# Patient Record
Sex: Male | Born: 1970 | Race: White | Hispanic: No | Marital: Married | Smoking: Never smoker
Health system: Southern US, Community
[De-identification: ages and names within clinical notes are randomized; demographics above are authoritative.]

## PROBLEM LIST (undated history)

## (undated) DIAGNOSIS — K219 Gastro-esophageal reflux disease without esophagitis: Secondary | ICD-10-CM

## (undated) DIAGNOSIS — R51 Headache: Secondary | ICD-10-CM

## (undated) DIAGNOSIS — M1990 Unspecified osteoarthritis, unspecified site: Secondary | ICD-10-CM

## (undated) DIAGNOSIS — F419 Anxiety disorder, unspecified: Secondary | ICD-10-CM

## (undated) DIAGNOSIS — N200 Calculus of kidney: Secondary | ICD-10-CM

## (undated) HISTORY — DX: Gastro-esophageal reflux disease without esophagitis: K21.9

## (undated) HISTORY — DX: Calculus of kidney: N20.0

## (undated) HISTORY — DX: Headache: R51

## (undated) HISTORY — DX: Unspecified osteoarthritis, unspecified site: M19.90

---

## 1999-02-12 ENCOUNTER — Encounter: Payer: Self-pay | Admitting: Emergency Medicine

## 1999-02-12 ENCOUNTER — Emergency Department (HOSPITAL_COMMUNITY): Admission: EM | Admit: 1999-02-12 | Discharge: 1999-02-12 | Payer: Self-pay | Admitting: Emergency Medicine

## 2002-03-12 ENCOUNTER — Encounter: Payer: Self-pay | Admitting: Emergency Medicine

## 2002-03-12 ENCOUNTER — Emergency Department (HOSPITAL_COMMUNITY): Admission: EM | Admit: 2002-03-12 | Discharge: 2002-03-12 | Payer: Self-pay | Admitting: Emergency Medicine

## 2002-12-19 ENCOUNTER — Encounter: Payer: Self-pay | Admitting: Emergency Medicine

## 2002-12-19 ENCOUNTER — Emergency Department (HOSPITAL_COMMUNITY): Admission: EM | Admit: 2002-12-19 | Discharge: 2002-12-19 | Payer: Self-pay | Admitting: Emergency Medicine

## 2004-02-26 ENCOUNTER — Emergency Department (HOSPITAL_COMMUNITY): Admission: EM | Admit: 2004-02-26 | Discharge: 2004-02-27 | Payer: Self-pay | Admitting: Emergency Medicine

## 2006-01-06 ENCOUNTER — Emergency Department (HOSPITAL_COMMUNITY): Admission: EM | Admit: 2006-01-06 | Discharge: 2006-01-06 | Payer: Self-pay | Admitting: Emergency Medicine

## 2007-12-16 ENCOUNTER — Encounter: Admission: RE | Admit: 2007-12-16 | Discharge: 2007-12-16 | Payer: Self-pay | Admitting: Family Medicine

## 2008-11-10 ENCOUNTER — Encounter: Admission: RE | Admit: 2008-11-10 | Discharge: 2008-11-10 | Payer: Self-pay | Admitting: Internal Medicine

## 2010-02-17 ENCOUNTER — Emergency Department (HOSPITAL_COMMUNITY): Admission: EM | Admit: 2010-02-17 | Discharge: 2010-02-18 | Payer: Self-pay | Admitting: Emergency Medicine

## 2010-02-17 ENCOUNTER — Ambulatory Visit (HOSPITAL_COMMUNITY): Admission: EM | Admit: 2010-02-17 | Discharge: 2010-02-17 | Payer: Self-pay | Admitting: Emergency Medicine

## 2010-07-17 HISTORY — PX: ANTERIOR CERVICAL DECOMP/DISCECTOMY FUSION: SHX1161

## 2010-11-28 ENCOUNTER — Ambulatory Visit: Payer: Self-pay | Admitting: Internal Medicine

## 2010-12-02 ENCOUNTER — Ambulatory Visit (INDEPENDENT_AMBULATORY_CARE_PROVIDER_SITE_OTHER): Payer: Self-pay | Admitting: Internal Medicine

## 2010-12-02 ENCOUNTER — Encounter: Payer: Self-pay | Admitting: Internal Medicine

## 2010-12-02 VITALS — BP 114/82 | HR 95 | Ht 69.0 in | Wt 211.0 lb

## 2010-12-02 DIAGNOSIS — Z136 Encounter for screening for cardiovascular disorders: Secondary | ICD-10-CM

## 2010-12-02 DIAGNOSIS — Z01818 Encounter for other preprocedural examination: Secondary | ICD-10-CM

## 2010-12-02 DIAGNOSIS — M5412 Radiculopathy, cervical region: Secondary | ICD-10-CM

## 2010-12-02 LAB — CBC WITH DIFFERENTIAL/PLATELET
Basophils Absolute: 0 10*3/uL (ref 0.0–0.1)
Lymphocytes Relative: 26.4 % (ref 12.0–46.0)
Monocytes Relative: 7.7 % (ref 3.0–12.0)
Neutrophils Relative %: 64.7 % (ref 43.0–77.0)
Platelets: 281 10*3/uL (ref 150.0–400.0)
RDW: 11.9 % (ref 11.5–14.6)

## 2010-12-02 LAB — BASIC METABOLIC PANEL
CO2: 30 mEq/L (ref 19–32)
Calcium: 9.9 mg/dL (ref 8.4–10.5)
Chloride: 104 mEq/L (ref 96–112)
Potassium: 4.1 mEq/L (ref 3.5–5.1)
Sodium: 139 mEq/L (ref 135–145)

## 2010-12-04 ENCOUNTER — Encounter: Payer: Self-pay | Admitting: Internal Medicine

## 2010-12-04 DIAGNOSIS — Z01818 Encounter for other preprocedural examination: Secondary | ICD-10-CM | POA: Insufficient documentation

## 2010-12-04 DIAGNOSIS — M5412 Radiculopathy, cervical region: Secondary | ICD-10-CM | POA: Insufficient documentation

## 2010-12-04 NOTE — Assessment & Plan Note (Signed)
Stable. Cervical spine surgery scheduled and pending

## 2010-12-04 NOTE — Assessment & Plan Note (Signed)
Obtain CBC, Chem-7. EKG obtained demonstrated normal sinus rhythm with normal intervals and axis. No evidence of ischemic change. Obtain chest x-ray PA and lateral. Pending these results overall there is no medical contraindication for surgery however in relation to general anesthesia there is family history of intolerance.

## 2010-12-04 NOTE — Progress Notes (Signed)
  Subjective:    Patient ID: Marciano Sequin, male    DOB: 07/31/1970, 40 y.o.   MRN: [id number]  HPI patient presents to clinic for preoperative evaluation prior to neck surgery. Has left arm radicular pain and paresthesias and is scheduled for what appears to be discectomy and fusion in the next several weeks. Available records reviewed and has history of kidney stones and community-acquired pneumonia August 2011 right upper lobe. States pneumonia symptoms resolved and has no cough or fever. Has had no prior surgeries therefore noted previous difficulty with anesthesia and denies any known coagulopathy or difficulty with she bleeding or bruisability. Does state his mother has had unspecified difficulty with anesthesia in the past however. Has no known cardiac disease or pulmonary disease. Notes intermittent mild atypical left lateral chest pain described as a muscle spasm. It is nonexertional without radiation not associated with diaphoresis or shortness of breath.  Reviewed past medical history, past surgical history, medications, allergies, social history and family history    Review of Systems  Cardiovascular: Positive for chest pain.  Musculoskeletal: Negative for back pain, arthralgias and gait problem.  Neurological: Positive for numbness. Negative for tremors and weakness.  All other systems reviewed and are negative.       Objective:   Physical Exam    Physical Exam  Vitals reviewed. Constitutional:  appears well-developed and well-nourished. No distress.  HENT:  Head: Normocephalic and atraumatic.  Right Ear: Tympanic membrane, external ear and ear canal normal.  Left Ear: Tympanic membrane, external ear and ear canal normal.  Nose: Nose normal.  Mouth/Throat: Oropharynx is clear and moist. No oropharyngeal exudate.  Eyes: Conjunctivae and EOM are normal. Pupils are equal, round, and reactive to light. Right eye exhibits no discharge. Left eye exhibits no discharge. No scleral  icterus.  Neck: Neck supple. No thyromegaly present. No carotid bruits Cardiovascular: Normal rate, regular rhythm and normal heart sounds.  Exam reveals no gallop and no friction rub.   No murmur heard. Pulmonary/Chest: Effort normal and breath sounds normal. No respiratory distress.  has no wheezes.  has no rales.  Abdomen: Soft, nondistended, nontender to palpation, positive bowel sounds. No masses or organomegaly noted. Lymphadenopathy:   no cervical adenopathy.  Neurological:  is alert.  Skin: Skin is warm and dry.  not diaphoretic.  Psychiatric: normal mood and affect.      Assessment & Plan:

## 2010-12-06 ENCOUNTER — Ambulatory Visit (INDEPENDENT_AMBULATORY_CARE_PROVIDER_SITE_OTHER)
Admission: RE | Admit: 2010-12-06 | Discharge: 2010-12-06 | Disposition: A | Discharge status: Home / Self Care | Payer: Self-pay | Source: Ambulatory Visit | Attending: Internal Medicine | Admitting: Internal Medicine

## 2010-12-06 DIAGNOSIS — Z01818 Encounter for other preprocedural examination: Secondary | ICD-10-CM

## 2010-12-07 ENCOUNTER — Telehealth: Payer: Self-pay

## 2010-12-07 NOTE — Progress Notes (Signed)
Done and documented.

## 2010-12-07 NOTE — Telephone Encounter (Signed)
Message copied by Kyung Rudd on Wed Dec 07, 2010  4:12 PM ------      Message from: Clifton Custard      Created: Tue Dec 06, 2010 12:33 PM       cxr nl

## 2010-12-07 NOTE — Telephone Encounter (Signed)
Left message to notify pt labs and chest x ray normal. Advised pt to call offc with any further question or concerns

## 2010-12-08 ENCOUNTER — Encounter: Payer: Self-pay | Admitting: Internal Medicine

## 2011-01-03 ENCOUNTER — Encounter (HOSPITAL_COMMUNITY)
Admission: RE | Admit: 2011-01-03 | Discharge: 2011-01-03 | Disposition: A | Discharge status: Home / Self Care | Payer: Worker's Compensation | Source: Ambulatory Visit | Attending: Orthopedic Surgery | Admitting: Orthopedic Surgery

## 2011-01-03 ENCOUNTER — Other Ambulatory Visit (HOSPITAL_COMMUNITY): Payer: Self-pay | Admitting: Orthopedic Surgery

## 2011-01-03 DIAGNOSIS — M4322 Fusion of spine, cervical region: Secondary | ICD-10-CM

## 2011-01-03 LAB — SURGICAL PCR SCREEN: MRSA, PCR: NEGATIVE

## 2011-01-03 LAB — CBC
Hemoglobin: 15.8 g/dL (ref 13.0–17.0)
MCH: 31 pg (ref 26.0–34.0)
MCV: 85.1 fL (ref 78.0–100.0)
RBC: 5.1 MIL/uL (ref 4.22–5.81)

## 2011-01-04 ENCOUNTER — Ambulatory Visit (HOSPITAL_COMMUNITY)
Admission: RE | Admit: 2011-01-04 | Discharge: 2011-01-05 | Disposition: A | Discharge status: Home / Self Care | Payer: Worker's Compensation | Source: Ambulatory Visit | Attending: Orthopedic Surgery | Admitting: Orthopedic Surgery

## 2011-01-04 ENCOUNTER — Ambulatory Visit (HOSPITAL_COMMUNITY): Payer: Worker's Compensation

## 2011-01-04 DIAGNOSIS — Z01812 Encounter for preprocedural laboratory examination: Secondary | ICD-10-CM | POA: Insufficient documentation

## 2011-01-04 DIAGNOSIS — M5020 Other cervical disc displacement, unspecified cervical region: Secondary | ICD-10-CM | POA: Insufficient documentation

## 2011-01-04 DIAGNOSIS — M47812 Spondylosis without myelopathy or radiculopathy, cervical region: Secondary | ICD-10-CM | POA: Insufficient documentation

## 2011-01-04 DIAGNOSIS — M5030 Other cervical disc degeneration, unspecified cervical region: Secondary | ICD-10-CM | POA: Insufficient documentation

## 2011-01-04 LAB — URINALYSIS, ROUTINE W REFLEX MICROSCOPIC
Hgb urine dipstick: NEGATIVE
Nitrite: NEGATIVE
Specific Gravity, Urine: 1.018 (ref 1.005–1.030)
Urobilinogen, UA: 0.2 mg/dL (ref 0.0–1.0)

## 2011-01-05 LAB — CBC
MCV: 86.1 fL (ref 78.0–100.0)
Platelets: 313 10*3/uL (ref 150–400)
RBC: 4.9 MIL/uL (ref 4.22–5.81)
WBC: 10.6 10*3/uL — ABNORMAL HIGH (ref 4.0–10.5)

## 2011-01-10 ENCOUNTER — Encounter: Payer: Self-pay | Admitting: Internal Medicine

## 2011-01-10 ENCOUNTER — Ambulatory Visit (INDEPENDENT_AMBULATORY_CARE_PROVIDER_SITE_OTHER): Payer: BC Managed Care – PPO | Admitting: Internal Medicine

## 2011-01-10 DIAGNOSIS — K219 Gastro-esophageal reflux disease without esophagitis: Secondary | ICD-10-CM

## 2011-01-10 DIAGNOSIS — M5412 Radiculopathy, cervical region: Secondary | ICD-10-CM

## 2011-01-10 MED ORDER — OMEPRAZOLE 40 MG PO CPDR: 40.0000 mg | DELAYED_RELEASE_CAPSULE | Freq: Every day | ORAL | Status: DC

## 2011-01-10 MED ORDER — TRAMADOL HCL 50 MG PO TABS: 50.0000 mg | ORAL_TABLET | Freq: Four times a day (QID) | ORAL | Status: DC | PRN

## 2011-01-15 DIAGNOSIS — K219 Gastro-esophageal reflux disease without esophagitis: Secondary | ICD-10-CM | POA: Insufficient documentation

## 2011-01-15 NOTE — Assessment & Plan Note (Signed)
Intolerant of 2 narcotics. Attempt Ultram p.r.n. No history of seizures

## 2011-01-15 NOTE — Assessment & Plan Note (Signed)
Begin omeprazole 40 mg b.i.d. x2 days followed by 40 mg daily. Prescription provided. Discussed possible contribution from esophageal retraction during surgery which is typically transient. Followup if no improvement or worsening.

## 2011-01-15 NOTE — Progress Notes (Signed)
  Subjective:    Patient ID: Marciano Sequin, male    DOB: 1971-07-04, 40 y.o.   MRN: [id number]  HPI Patient presents to clinic for evaluation of possible GERD. Recently completed anterior cervical discectomy and fusion approximate 6 days ago. Was prescribed Percocet which he could not tolerate followed by Norco which  he could not tolerate. Currently taking no pain medication. Next intermittent episodes of chest pressure, acid reflux and regurgitation of food. Denies dysphagia. Surgery office instructed him to begin omeprazole OTC which is done for several days. No alleviating or exacerbating factors. No other complaints.    Review of Systems  Constitutional: Negative for fever and chills.  Gastrointestinal: Negative for nausea, vomiting, abdominal pain and abdominal distention.  Musculoskeletal: Positive for arthralgias. Negative for back pain.       Objective:   Physical Exam  Nursing note and vitals reviewed. Constitutional: He appears well-developed and well-nourished. No distress.  HENT:  Head: Normocephalic and atraumatic.  Right Ear: External ear normal.  Nose: Nose normal.  Eyes: Conjunctivae are normal. No scleral icterus.  Neck:       Currently wearing hard cervical collar  Neurological: He is alert.  Skin: He is not diaphoretic.          Assessment & Plan:

## 2011-01-17 NOTE — Op Note (Signed)
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  OPERATIVE PROCEDURE:  Anterior cervical diskectomy and fusion C5-6, C6- 7.  COMPLICATIONS:  None.  CONDITION:  Stable.  INSTRUMENTATION SYSTEM USED:  Synthes anterior cervical vector plate 34- mm in length with 16-mm screws into the body of C5, 14-mm screws into the bodies of 6 and 7.  Titanium Titan intervertebral graft, 8-mm medium lordotic packed with DBX mix.  HISTORY:  This is a very pleasant 40 year old gentleman who is having progressive debilitating neck and left arm pain.  Attempts at conservative management have failed to alleviate his symptoms.  As a result, we discussed surgical solution to his problem.  All appropriate risks, benefits, and alternatives of surgery were discussed and consent was obtained.  OPERATIVE NOTE:  The patient was brought to the operating room, placed supine on the operating table.  After successful induction of general anesthesia and endotracheal intubation, TEDs, SCDs were applied.  Rolled towels were placed between the shoulder blades and the neck was prepped and draped in standard fashion.  Once this was completed, a time-out was done to confirm patient procedure, affected extremity, and all other pertinent important data.  Once that was completed, we then proceeded with the surgery.  X-ray was taken to confirm the appropriate incision site and a transverse  incision was made on the left side of the neck. Sharp dissection was carried out down and through the platysma.  A standard Clementeen Graham approach was taken dissecting bluntly through the deep cervical fascia sweeping the trachea and esophagus to the right identifying protected carotid sheath with a finger on the left.  Once I was able to palpate the anterior longitudinal ligament, I then placed a retractor to hold the trachea and esophagus.  I then placed a needle into the 5-6 disk space. I took an x-ray and confirmed that at the appropriate level.  Once this was done, I used bipolar electrocautery to mobilize the longus coli muscles from the midbody of C5 to the midbody of C7.  Once this was done bilaterally, I placed self-retaining retractor, Caspar blades underneath the longus coli muscle, deflated the endotracheal cuff and expanded to appropriate width.  I then placed distraction pins into the bodies of C7 and C6 and distracted the disk space.  An annulotomy was performed with the #15 blade scalpel and then using a combination of pituitary rongeurs, curettes, and Kerrison rongeurs, I removed all the disk material.  I then used a small microcurette to develop plane underneath the posterior longitudinal ligament.  Once this was done, I used a 1-mm Kerrison to resect the posterior longitudinal ligament.  I then removed some of the osteophyte from  the uncovertebral joint.  At this point, I had an adequate decompression.  I rasped the endplates and used a high-speed bur to take down some remaining osteophyte.  Once this was done, I irrigated the wound copiously with normal saline, made sure I had bleeding subchondral bone at both endplates and then trialed intervertebral spaces.  I took an 8-mm medium Titan titanium cage, packed it with DBX with cortical and cancellous chips mix and malleted to the appropriate depth.  At this point, I removed the distraction pin at C7 and then  repositioned at C5 and distracted the 5-6 disk space.  I used the same technique to perform a diskectomy here.  At this time, I was able to pull out a herniated fragmented disk on the left side.  This allowed me access to get underneath the posterior longitudinal ligament and then resect it with a 1-mm Kerrison.  Again using a high-speed bur, I took down some of the osteophyte.  I then rasped the endplates and placed the same size graft. At this point, I had excellent fixation and good restoration of intervertebral height.  I contoured an anterior cervical Synthes vector plate and secured it with self-drilling screws into the bodies of C5, C6, and C7 without complication.  Once all screws were properly torqued down so that the anti-backout device was locked in place.  I then swept the esophagus to ensure that it did not become entrapped beneath the plate.  Once I was assured that the esophagus was free from being entrapped, I then returned it to the midline with the trachea.  I then irrigated copiously with normal saline and made sure I had hemostasis. Once this was confirmed, I closed the platysma with interrupted 2-0 Vicryl sutures and skin with 3-0 Monocryl.  Steri-Strips and dry dressing was applied.  The patient was extubated and transported to the PACU without incident.  Final x-rays were satisfactory.     Alvy Beal, MD     DDB/MEDQ  D:  01/04/2011  T:  01/05/2011  Job:  [id number]  Electronically Signed by Venita Lick MD on 01/17/2011 07:51:24 PM

## 2011-02-16 ENCOUNTER — Ambulatory Visit (INDEPENDENT_AMBULATORY_CARE_PROVIDER_SITE_OTHER): Payer: BC Managed Care – PPO | Admitting: Internal Medicine

## 2011-02-16 ENCOUNTER — Encounter: Payer: Self-pay | Admitting: Internal Medicine

## 2011-02-16 DIAGNOSIS — J329 Chronic sinusitis, unspecified: Secondary | ICD-10-CM

## 2011-02-16 MED ORDER — FLUTICASONE PROPIONATE 50 MCG/ACT NA SUSP: 2.0000 | Freq: Every day | NASAL | Status: DC

## 2011-02-16 MED ORDER — AMOXICILLIN-POT CLAVULANATE 875-125 MG PO TABS: 1.0000 | ORAL_TABLET | Freq: Two times a day (BID) | ORAL | Status: AC

## 2011-02-16 NOTE — Assessment & Plan Note (Addendum)
Begin augmentin and flonase. Followup if no improvement or worsening.

## 2011-02-16 NOTE — Progress Notes (Signed)
  Subjective:    Patient ID: Seth Edwards, male    DOB: [birth year], 40 y.o.   MRN: [id number]  HPI Pt presents to clinic for evaluation of possible sinusitis. Notes 1wk h/o nasal congestion with minimal blood in drainage.  +slight np cough. Notes 2d h/o frontal ha. Denies teeth pain, fever or chills. No exacerbating or alleviating factors. No other complaints.  Reviewed pmh, medications and allergies    Review of Systems see hpi     Objective:   Physical Exam  Nursing note and vitals reviewed. Constitutional: He appears well-developed and well-nourished. No distress.  HENT:  Head: Normocephalic and atraumatic.  Right Ear: Tympanic membrane, external ear and ear canal normal.  Left Ear: Tympanic membrane, external ear and ear canal normal.  Nose: Right sinus exhibits frontal sinus tenderness. Left sinus exhibits frontal sinus tenderness.  Mouth/Throat: Uvula is midline, oropharynx is clear and moist and mucous membranes are normal. No oropharyngeal exudate.  Eyes: Conjunctivae are normal. No scleral icterus.  Neck: Neck supple.  Cardiovascular: Normal rate, regular rhythm and normal heart sounds.   Pulmonary/Chest: Effort normal and breath sounds normal. No respiratory distress. He has no wheezes. He has no rales.  Neurological: He is alert.  Skin: Skin is warm and dry. He is not diaphoretic.  Psychiatric: He has a normal mood and affect.          Assessment & Plan:

## 2011-03-09 ENCOUNTER — Ambulatory Visit (INDEPENDENT_AMBULATORY_CARE_PROVIDER_SITE_OTHER): Payer: BC Managed Care – PPO | Admitting: Internal Medicine

## 2011-03-09 ENCOUNTER — Encounter: Payer: Self-pay | Admitting: Internal Medicine

## 2011-03-09 DIAGNOSIS — R51 Headache: Secondary | ICD-10-CM | POA: Insufficient documentation

## 2011-03-09 DIAGNOSIS — R519 Headache, unspecified: Secondary | ICD-10-CM | POA: Insufficient documentation

## 2011-03-09 MED ORDER — METHYLPREDNISOLONE 4 MG PO KIT: PACK | ORAL | Status: AC

## 2011-03-09 NOTE — Progress Notes (Signed)
  Subjective:    Patient ID: Seth Edwards, male    DOB: [birth year], 40 y.o.   MRN: [id number]  HPI Pt presents to clinic for evaluation of headaches. Seen last visit with frontal ha's associated with congestion. Tx'ed with augmentin and flonase without improvement. Does still believe nasal congestion may contribute but also possible neck contribution to ha with recent cervical spine surgery. Attempts robaxin prn but sporadically recently. Denies neurologic deficit such as numbness, tingling , weakness, visual changes. No other alleviating or exacerbating factors. No other complaints.  Past Medical History  Diagnosis Date  . Arthritis   . Headache   . GERD (gastroesophageal reflux disease)   . Kidney stones    No past surgical history on file.  reports that he has never smoked. He has never used smokeless tobacco. He reports that he drinks alcohol. He reports that he does not use illicit drugs. family history includes Diabetes in his maternal grandfather and Heart disease in his maternal grandfather. Allergies  Allergen Reactions  . Doxycycline   . Iohexol      Code: RASH, Desc: pt developed rash around neck just after injection. this was first time pt ever had contrast. benadryl given per radiologist., Onset Date: [id number]   . Septra (Bactrim)        Review of Systems see hpi     Objective:   Physical Exam  Nursing note and vitals reviewed. Constitutional: He appears well-developed and well-nourished. No distress.  HENT:  Head: Normocephalic and atraumatic.  Right Ear: External ear normal.  Left Ear: External ear normal.  Nose: Nose normal.  Mouth/Throat: Oropharynx is clear and moist. No oropharyngeal exudate.  Eyes: Conjunctivae are normal. Right eye exhibits no discharge. Left eye exhibits no discharge. No scleral icterus.  Neck: Neck supple.  Cardiovascular: Normal rate, regular rhythm and normal heart sounds.  Exam reveals no gallop and no friction rub.   No murmur  heard. Pulmonary/Chest: Effort normal and breath sounds normal. No respiratory distress. He has no wheezes. He has no rales.  Neurological: He is alert.  Skin: Skin is warm and dry. He is not diaphoretic.  Psychiatric: He has a normal mood and affect.          Assessment & Plan:

## 2011-03-09 NOTE — Assessment & Plan Note (Signed)
Failed abx course for sinusitis. Possible contribution from rhinosinusitis vs cervical spine. Begin medrol dosepak and take robaxin bid for 3 days holding for sedation.

## 2011-03-13 ENCOUNTER — Emergency Department (HOSPITAL_COMMUNITY)
Admission: EM | Admit: 2011-03-13 | Discharge: 2011-03-13 | Disposition: A | Discharge status: Home / Self Care | Payer: BC Managed Care – PPO | Attending: Emergency Medicine | Admitting: Emergency Medicine

## 2011-03-13 ENCOUNTER — Emergency Department (HOSPITAL_COMMUNITY): Payer: BC Managed Care – PPO

## 2011-03-13 DIAGNOSIS — N200 Calculus of kidney: Secondary | ICD-10-CM | POA: Insufficient documentation

## 2011-03-13 DIAGNOSIS — K5900 Constipation, unspecified: Secondary | ICD-10-CM | POA: Insufficient documentation

## 2011-03-13 DIAGNOSIS — R1084 Generalized abdominal pain: Secondary | ICD-10-CM | POA: Insufficient documentation

## 2011-03-13 DIAGNOSIS — R110 Nausea: Secondary | ICD-10-CM | POA: Insufficient documentation

## 2011-03-13 LAB — CBC
MCH: 30.2 pg (ref 26.0–34.0)
MCHC: 35.9 g/dL (ref 30.0–36.0)
MCV: 84 fL (ref 78.0–100.0)
Platelets: 268 10*3/uL (ref 150–400)
RBC: 5.2 MIL/uL (ref 4.22–5.81)

## 2011-03-13 LAB — DIFFERENTIAL
Eosinophils Absolute: 0.1 10*3/uL (ref 0.0–0.7)
Eosinophils Relative: 1 % (ref 0–5)
Lymphs Abs: 2 10*3/uL (ref 0.7–4.0)
Monocytes Absolute: 0.7 10*3/uL (ref 0.1–1.0)
Monocytes Relative: 8 % (ref 3–12)
Neutrophils Relative %: 67 % (ref 43–77)

## 2011-03-13 LAB — URINALYSIS, ROUTINE W REFLEX MICROSCOPIC
Leukocytes, UA: NEGATIVE
Nitrite: NEGATIVE
Specific Gravity, Urine: 1.016 (ref 1.005–1.030)
pH: 6.5 (ref 5.0–8.0)

## 2011-03-13 LAB — POCT I-STAT, CHEM 8
BUN: 13 mg/dL (ref 6–23)
Calcium, Ion: 1.2 mmol/L (ref 1.12–1.32)
Chloride: 102 mEq/L (ref 96–112)
Creatinine, Ser: 1.1 mg/dL (ref 0.50–1.35)
Glucose, Bld: 106 mg/dL — ABNORMAL HIGH (ref 70–99)
TCO2: 24 mmol/L (ref 0–100)

## 2011-04-10 ENCOUNTER — Telehealth: Payer: Self-pay | Admitting: Internal Medicine

## 2011-04-10 MED ORDER — OMEPRAZOLE 40 MG PO CPDR: 40.0000 mg | DELAYED_RELEASE_CAPSULE | Freq: Every day | ORAL | Status: DC

## 2011-04-10 NOTE — Telephone Encounter (Signed)
Refill- omeprazole dr 40mg  capsule. Take one capsule daily. Qty 30. Last fill 7.23.12

## 2011-07-25 ENCOUNTER — Encounter (HOSPITAL_COMMUNITY): Payer: Self-pay | Admitting: Emergency Medicine

## 2011-07-25 ENCOUNTER — Emergency Department (HOSPITAL_COMMUNITY): Payer: Self-pay

## 2011-07-25 ENCOUNTER — Emergency Department (HOSPITAL_COMMUNITY)
Admission: EM | Admit: 2011-07-25 | Discharge: 2011-07-25 | Disposition: A | Discharge status: Home / Self Care | Payer: Self-pay | Attending: Emergency Medicine | Admitting: Emergency Medicine

## 2011-07-25 DIAGNOSIS — R0789 Other chest pain: Secondary | ICD-10-CM | POA: Insufficient documentation

## 2011-07-25 DIAGNOSIS — M129 Arthropathy, unspecified: Secondary | ICD-10-CM | POA: Insufficient documentation

## 2011-07-25 DIAGNOSIS — K219 Gastro-esophageal reflux disease without esophagitis: Secondary | ICD-10-CM | POA: Insufficient documentation

## 2011-07-25 DIAGNOSIS — Z79899 Other long term (current) drug therapy: Secondary | ICD-10-CM | POA: Insufficient documentation

## 2011-07-25 MED ORDER — OMEPRAZOLE 20 MG PO CPDR: 20.0000 mg | DELAYED_RELEASE_CAPSULE | Freq: Every day | ORAL | Status: DC

## 2011-07-25 MED ORDER — GI COCKTAIL ~~LOC~~
30.0000 mL | Freq: Once | ORAL | Status: AC
  Administered 2011-07-25: 30 mL via ORAL
  Filled 2011-07-25: qty 30

## 2011-07-25 NOTE — ED Notes (Signed)
Family at bedside. 

## 2011-07-25 NOTE — ED Notes (Signed)
Pt. discharged to home with wife, pt. Ambulatory gait steady, NAD noted

## 2011-07-25 NOTE — ED Provider Notes (Signed)
I saw and evaluated the patient, reviewed the resident's note and I agree with the findings and plan.   Quintavius Niebuhr A. Patrica Duel, MD 07/25/11 2351

## 2011-07-25 NOTE — ED Provider Notes (Signed)
History     CSN: [id number]  Arrival date & time 07/25/11  1707   First MD Initiated Contact with Patient 07/25/11 1922      Chief Complaint  Patient presents with  . Chest Pain    (Consider location/radiation/quality/duration/timing/severity/associated sxs/prior treatment) HPI history from patient and spouse Patient is a 41 year old male with history of GERD who presents for chest pain. This started one week ago. It is located in the left side of his chest. It has been constant. He has had a waxing and waning changes of the severity of the pain. It is burning in quality. He notes it being worse when he lays flat and better when he sits up. He has not had any associated dyspnea, syncope, nausea, vomiting , fever. He has history of GERD but typically has starting radiation retrosternally. This is different than his typical GERD which prompts his evaluation today. He has not noted any change in the symptoms with exertion. He has not had any recent fever. No preceding URI symptoms or febrile illness. No recent trauma. Overall severity moderate.  Past Medical History  Diagnosis Date  . Arthritis   . Headache   . GERD (gastroesophageal reflux disease)   . Kidney stones     History reviewed. No pertinent past surgical history.  Family History  Problem Relation Age of Onset  . Heart disease Maternal Grandfather   . Diabetes Maternal Grandfather     History  Substance Use Topics  . Smoking status: Never Smoker   . Smokeless tobacco: Never Used  . Alcohol Use: Yes      Review of Systems  Constitutional: Negative for fever, chills and activity change.  HENT: Negative for congestion and neck pain.   Respiratory: Negative for cough, chest tightness, shortness of breath and wheezing.   Cardiovascular: Negative for palpitations and leg swelling.  Gastrointestinal: Negative for nausea, vomiting, abdominal pain, diarrhea and abdominal distention.  Genitourinary: Negative for  difficulty urinating.  Musculoskeletal: Negative for gait problem.  Skin: Negative for rash.  Neurological: Negative for weakness and numbness.  Psychiatric/Behavioral: Negative for behavioral problems and confusion.  All other systems reviewed and are negative.    Allergies  Doxycycline; Septra; and Ivp dye  Home Medications   Current Outpatient Rx  Name Route Sig Dispense Refill  . HYDROCODONE-ACETAMINOPHEN 5-325 MG PO TABS Oral Take 1 tablet by mouth every 6 (six) hours as needed. For neck pain     . OMEPRAZOLE 40 MG PO CPDR Oral Take 40 mg by mouth daily as needed. For heartburn     . TRAMADOL HCL 50 MG PO TABS Oral Take 50 mg by mouth every 6 (six) hours as needed. For neck pain       BP 142/99  Pulse 83  Temp(Src) 98.3 F (36.8 C) (Oral)  Resp 16  SpO2 97%  Physical Exam  Nursing note and vitals reviewed. Constitutional: He is oriented to person, place, and time. He appears well-developed and well-nourished. No distress.  HENT:  Head: Normocephalic.  Nose: Nose normal.  Eyes: EOM are normal. Pupils are equal, round, and reactive to light.  Neck: Normal range of motion. Neck supple. No JVD present.  Cardiovascular: Normal rate, regular rhythm and intact distal pulses.   No murmur heard. Pulmonary/Chest: Effort normal and breath sounds normal. No respiratory distress. He exhibits no tenderness.  Abdominal: Soft. Bowel sounds are normal. He exhibits no distension. There is no tenderness.  Musculoskeletal: Normal range of motion. He exhibits no edema  and no tenderness.       No calf ttp  Neurological: He is alert and oriented to person, place, and time.       Normal strength  Skin: Skin is warm and dry. He is not diaphoretic.  Psychiatric: He has a normal mood and affect. His behavior is normal. Thought content normal.    ED Course  Procedures (including critical care time)  ECG on 07/25/2011 at 1709: Heart rate 97. Normal sinus rhythm. Normal intervals. Normal  axis. No hypertrophy. Incomplete right bundle branch block.nonspecific T wave changes. No ischemic ST changes. When compared to ECG from 01/04/2011, no significant change found besides decreased heart rate.   Labs Reviewed  POCT I-STAT TROPONIN I  I-STAT TROPONIN I   Dg Chest 2 View  07/25/2011  *RADIOLOGY REPORT*  Clinical Data: Chest pain, headache  CHEST - 2 VIEW  Comparison: 12/06/2010  Findings: Prominent heart size and low lung volumes.  No focal pneumonia, collapse, consolidation, effusion or pneumothorax. Lower cervical fusion hardware noted.  Trachea midline.  IMPRESSION: Low lung volumes.  No acute chest finding  Original Report Authenticated By: Judie Petit. Ruel Favors, M.D.     1. Atypical chest pain       MDM   Patient here with atypical chest pain. He does have underlying history of GERD though the symptoms are different. He notes no exertional worsening but does have worsening when he lays flat. He has not had any fever or preceding febrile illness. EKG does not show findings suggestive of pericarditis. Bedside ultrasound was performed which was negative for pericardial effusion. Patient's chest x-ray is unremarkable. He described one week of chest pain without any periods of resolution. Based on this and his concern about underlying cardiac etiology, single troponin obtained. This was 0.00. Based on his lack of risk factors and atypical history, strongly doubt primary coronary etiology. He is low risk by Wells and PERC negative for PE. No obvious infectious etiology. Based on his history of GERD and otherwise negative workup, this atypical chest pain may be GI related. Discussed return precautions as well as PCP followup.        Milus Glazier 07/25/11 2259  Kathlene November Mcclellan Demarais 07/25/11 2340

## 2011-07-25 NOTE — ED Notes (Signed)
Pt c/o left sided CP x 1 week intermittently; pt sts some nausea and dizziness

## 2011-11-08 ENCOUNTER — Ambulatory Visit (INDEPENDENT_AMBULATORY_CARE_PROVIDER_SITE_OTHER): Payer: Self-pay | Admitting: Family Medicine

## 2011-11-08 ENCOUNTER — Encounter: Payer: Self-pay | Admitting: Family Medicine

## 2011-11-08 VITALS — BP 124/96 | Temp 97.7°F | Wt 209.0 lb

## 2011-11-08 DIAGNOSIS — R209 Unspecified disturbances of skin sensation: Secondary | ICD-10-CM

## 2011-11-08 DIAGNOSIS — R208 Other disturbances of skin sensation: Secondary | ICD-10-CM

## 2011-11-08 DIAGNOSIS — R030 Elevated blood-pressure reading, without diagnosis of hypertension: Secondary | ICD-10-CM

## 2011-11-08 NOTE — Progress Notes (Signed)
  Subjective:    Patient ID: Seth Edwards, male    DOB: Sep 04, 1970, 41 y.o.   MRN: [id number]  HPI  Concern for elevated blood pressure. Recent home readings 150-160 systolic and around 100 diastolic. Patient has had some occasional headaches recently. Never treated for hypertension. Recently has seen psychiatrist was placed on Remeron and felt this may have elevated his blood pressure. Currently only taking lorazepam. Denies any significant alcohol use. Frequent nonsteroidal use with Advil secondary to some chronic neck pain.  Patient complains of some tingling and occasional burning sensation involving hands and feet. No history of diabetes. Not a vegetarian. Denies any focal weakness. Only current medication is as needed lorazepam and omeprazole   Review of Systems  Constitutional: Negative for fever and chills.  Respiratory: Negative for cough and shortness of breath.   Cardiovascular: Negative for chest pain, palpitations and leg swelling.  Gastrointestinal: Negative for abdominal pain.  Genitourinary: Negative for dysuria.  Neurological: Positive for headaches. Negative for dizziness, tremors and syncope.  Hematological: Negative for adenopathy. Does not bruise/bleed easily.       Objective:   Physical Exam  Constitutional: He is oriented to person, place, and time. He appears well-developed and well-nourished. No distress.  Neck: Neck supple. No thyromegaly present.  Cardiovascular: Normal rate and regular rhythm.   Pulmonary/Chest: Effort normal and breath sounds normal. No respiratory distress. He has no wheezes. He has no rales.  Musculoskeletal: He exhibits no edema.  Lymphadenopathy:    He has no cervical adenopathy.  Neurological: He is alert and oriented to person, place, and time. He has normal reflexes. No cranial nerve deficit.       No sensory impairment to touch in extremities          Assessment & Plan:  #1 elevated blood pressure. Repeat after rest  124/96 left and right arm seated. We discussed lifestyle management. Would not start medication yet. Reassess in one to 2 months. Exercise as tolerated. Low-sodium diet. #2 intermittent dysesthesias hands and feet.  Check labs with TSH, B12, CBC, and basic metabolic panel

## 2011-11-08 NOTE — Patient Instructions (Signed)

## 2011-11-09 ENCOUNTER — Encounter: Payer: Self-pay | Admitting: Family Medicine

## 2011-11-09 LAB — CBC WITH DIFFERENTIAL/PLATELET
Basophils Absolute: 0.1 10*3/uL (ref 0.0–0.1)
HCT: 48.7 % (ref 39.0–52.0)
Lymphs Abs: 2.1 10*3/uL (ref 0.7–4.0)
MCV: 89.6 fl (ref 78.0–100.0)
Monocytes Absolute: 0.4 10*3/uL (ref 0.1–1.0)
Platelets: 319 10*3/uL (ref 150.0–400.0)
RDW: 12.9 % (ref 11.5–14.6)

## 2011-11-09 LAB — BASIC METABOLIC PANEL
BUN: 10 mg/dL (ref 6–23)
Chloride: 100 mEq/L (ref 96–112)
Creatinine, Ser: 1 mg/dL (ref 0.4–1.5)
GFR: 89.96 mL/min (ref >60.00)
Glucose, Bld: 81 mg/dL (ref 70–99)
Potassium: 3.9 mEq/L (ref 3.5–5.1)

## 2011-11-09 LAB — TSH: TSH: 0.47 u[IU]/mL (ref 0.35–5.50)

## 2011-11-10 NOTE — Progress Notes (Signed)
Quick Note:  Pt informed on cell VM ______ 

## 2012-02-08 ENCOUNTER — Emergency Department (HOSPITAL_COMMUNITY)
Admission: EM | Admit: 2012-02-08 | Discharge: 2012-02-08 | Disposition: A | Discharge status: Home / Self Care | Payer: Self-pay | Attending: Emergency Medicine | Admitting: Emergency Medicine

## 2012-02-08 ENCOUNTER — Encounter (HOSPITAL_COMMUNITY): Payer: Self-pay | Admitting: *Deleted

## 2012-02-08 ENCOUNTER — Emergency Department (HOSPITAL_COMMUNITY): Payer: Self-pay

## 2012-02-08 DIAGNOSIS — N200 Calculus of kidney: Secondary | ICD-10-CM | POA: Insufficient documentation

## 2012-02-08 DIAGNOSIS — Z7982 Long term (current) use of aspirin: Secondary | ICD-10-CM | POA: Insufficient documentation

## 2012-02-08 DIAGNOSIS — Z8739 Personal history of other diseases of the musculoskeletal system and connective tissue: Secondary | ICD-10-CM | POA: Insufficient documentation

## 2012-02-08 DIAGNOSIS — Z79899 Other long term (current) drug therapy: Secondary | ICD-10-CM | POA: Insufficient documentation

## 2012-02-08 DIAGNOSIS — K219 Gastro-esophageal reflux disease without esophagitis: Secondary | ICD-10-CM | POA: Insufficient documentation

## 2012-02-08 LAB — CBC
HCT: 44 % (ref 39.0–52.0)
Hemoglobin: 15.6 g/dL (ref 13.0–17.0)
MCH: 30.7 pg (ref 26.0–34.0)
MCHC: 35.5 g/dL (ref 30.0–36.0)
MCV: 86.6 fL (ref 78.0–100.0)
Platelets: 255 K/uL (ref 150–400)
RBC: 5.08 MIL/uL (ref 4.22–5.81)
RDW: 11.9 % (ref 11.5–15.5)
WBC: 9.4 K/uL (ref 4.0–10.5)

## 2012-02-08 LAB — POCT I-STAT, CHEM 8
BUN: 12 mg/dL (ref 6–23)
Calcium, Ion: 1.24 mmol/L — ABNORMAL HIGH (ref 1.12–1.23)
HCT: 47 % (ref 39.0–52.0)
TCO2: 29 mmol/L (ref 0–100)

## 2012-02-08 LAB — URINALYSIS, ROUTINE W REFLEX MICROSCOPIC
Bilirubin Urine: NEGATIVE
Ketones, ur: NEGATIVE mg/dL
Protein, ur: NEGATIVE mg/dL
Urobilinogen, UA: 0.2 mg/dL (ref 0.0–1.0)

## 2012-02-08 LAB — URINE MICROSCOPIC-ADD ON

## 2012-02-08 MED ORDER — TAMSULOSIN HCL 0.4 MG PO CAPS: 0.4000 mg | ORAL_CAPSULE | Freq: Two times a day (BID) | ORAL | Status: DC

## 2012-02-08 MED ORDER — PROMETHAZINE HCL 25 MG PO TABS: 25.0000 mg | ORAL_TABLET | Freq: Four times a day (QID) | ORAL | Status: DC | PRN

## 2012-02-08 MED ORDER — KETOROLAC TROMETHAMINE 60 MG/2ML IM SOLN
60.0000 mg | Freq: Once | INTRAMUSCULAR | Status: AC
  Administered 2012-02-08: 60 mg via INTRAMUSCULAR
  Filled 2012-02-08: qty 2

## 2012-02-08 MED ORDER — NAPROXEN 500 MG PO TABS: 500.0000 mg | ORAL_TABLET | Freq: Two times a day (BID) | ORAL | Status: DC

## 2012-02-08 MED ORDER — HYDROCODONE-ACETAMINOPHEN 5-500 MG PO TABS: 1.0000 | ORAL_TABLET | Freq: Four times a day (QID) | ORAL | Status: DC | PRN

## 2012-02-08 NOTE — ED Notes (Signed)
Pt c/o hematuria x 1 week and R flank/RLQ pain and nausea for several days.  Tonight he noticed that his urine was dark and decided to come in to ED.  Hx of kidney stones and this pain is similar, though worse than normal.

## 2012-02-08 NOTE — ED Provider Notes (Signed)
History     CSN: [id number]  Arrival date & time 02/08/12  0404   First MD Initiated Contact with Patient 02/08/12 [phone]      Chief Complaint  Patient presents with  . Hematuria  . Flank Pain    (Consider location/radiation/quality/duration/timing/severity/associated sxs/prior treatment) HPI Comments: 41 year old male with a history of kidney stones in the past and hypertension who presents with a complaint of approximately one week of difficulty urinating which she states is a difficulty starting which has progressed to cause a gradual onset of right-sided abdominal pain and flank pain with hematuria in the last 24 hours. The symptoms are gradually getting worse, not associated with fevers chills nausea or vomiting, nothing seems to make this better or worse. He has not recently been treated for kidney stones and has no history of needing extraction.  Patient is a 41 y.o. male presenting with hematuria and flank pain. The history is provided by the patient.  Hematuria Associated symptoms include flank pain.  Flank Pain    Past Medical History  Diagnosis Date  . Arthritis   . Headache   . GERD (gastroesophageal reflux disease)   . Kidney stones     Past Surgical History  Procedure Date  . Anterior cervical decomp/discectomy fusion 2012    c5 - c7    Family History  Problem Relation Age of Onset  . Heart disease Maternal Grandfather   . Diabetes Maternal Grandfather     History  Substance Use Topics  . Smoking status: Never Smoker   . Smokeless tobacco: Never Used  . Alcohol Use: No      Review of Systems  Genitourinary: Positive for hematuria and flank pain.  All other systems reviewed and are negative.    Allergies  Centrum; Doxycycline; Septra; and Ivp dye  Home Medications   Current Outpatient Rx  Name Route Sig Dispense Refill  . ASPIRIN-ACETAMINOPHEN-CAFFEINE 250-250-65 MG PO TABS Oral Take 1 tablet by mouth every 6 (six) hours as needed. For  headache    . ATENOLOL 25 MG PO TABS Oral Take 25-50 mg by mouth daily. Depending on blood pressure    . DULOXETINE HCL 30 MG PO CPEP Oral Take 30 mg by mouth daily.    Marland Kitchen LORAZEPAM 0.5 MG PO TABS Oral Take 0.5 mg by mouth 2 (two) times daily as needed. For panic attacks    . HYDROCODONE-ACETAMINOPHEN 5-500 MG PO TABS Oral Take 1-2 tablets by mouth every 6 (six) hours as needed for pain. 15 tablet 0  . NAPROXEN 500 MG PO TABS Oral Take 1 tablet (500 mg total) by mouth 2 (two) times daily with a meal. 30 tablet 0  . PROMETHAZINE HCL 25 MG PO TABS Oral Take 1 tablet (25 mg total) by mouth every 6 (six) hours as needed for nausea. 12 tablet 0  . TAMSULOSIN HCL 0.4 MG PO CAPS Oral Take 1 capsule (0.4 mg total) by mouth 2 (two) times daily. 10 capsule 0    BP 131/91  Pulse 91  Temp 97.1 F (36.2 C) (Oral)  Resp 18  SpO2 95%  Physical Exam  Nursing note and vitals reviewed. Constitutional: He appears well-developed and well-nourished. No distress.  HENT:  Head: Normocephalic and atraumatic.  Mouth/Throat: Oropharynx is clear and moist. No oropharyngeal exudate.  Eyes: Conjunctivae and EOM are normal. Pupils are equal, round, and reactive to light. Right eye exhibits no discharge. Left eye exhibits no discharge. No scleral icterus.  Neck: Normal range of motion.  Neck supple. No JVD present. No thyromegaly present.  Cardiovascular: Normal rate, regular rhythm, normal heart sounds and intact distal pulses.  Exam reveals no gallop and no friction rub.   No murmur heard. Pulmonary/Chest: Effort normal and breath sounds normal. No respiratory distress. He has no wheezes. He has no rales.  Abdominal: Soft. Bowel sounds are normal. He exhibits no distension and no mass. There is no tenderness.       No CVA or abdominal tenderness.  Musculoskeletal: Normal range of motion. He exhibits no edema and no tenderness.  Lymphadenopathy:    He has no cervical adenopathy.  Neurological: He is alert.  Coordination normal.  Skin: Skin is warm and dry. No rash noted. No erythema.  Psychiatric: He has a normal mood and affect. His behavior is normal.    ED Course  Procedures (including critical care time)  Labs Reviewed  URINALYSIS, ROUTINE W REFLEX MICROSCOPIC - Abnormal; Notable for the following:    Hgb urine dipstick LARGE (*)     All other components within normal limits  POCT I-STAT, CHEM 8 - Abnormal; Notable for the following:    Glucose, Bld 116 (*)     Calcium, Ion 1.24 (*)     All other components within normal limits  CBC  URINE MICROSCOPIC-ADD ON   Ct Abdomen Pelvis Wo Contrast  02/08/2012  *RADIOLOGY REPORT*  Clinical Data: Right flank/right lower quadrant pain, hematuria, history of renal stones  CT ABDOMEN AND PELVIS WITHOUT CONTRAST  Technique:  Multidetector CT imaging of the abdomen and pelvis was performed following the standard protocol without intravenous contrast.  Comparison: [facility] CT abdomen/pelvis dated 02/15/2010  Findings: Lung bases are clear.  Unenhanced liver, spleen, pancreas, and adrenal glands within normal limits.  Gallbladder is unremarkable.  No intrahepatic or extrahepatic ductal dilatation.  3 mm nonobstructing left lower pole calculus (series 2/image 42). Additional 2 mm nonobstructing left upper pole calculus (coronal image 92).  Mild fullness of the right renal collecting system.  No evidence of bowel obstruction.  Normal appendix.  Mild colonic diverticulosis, without associate inflammatory changes.  No evidence of abdominal aortic aneurysm.  No abdominopelvic ascites.  No suspicious abdominopelvic lymphadenopathy.  Prostate is unremarkable.  5 mm proximal right ureteral calculus (series 2/image 44).  No distal ureteral or bladder calculi.  Mild degenerative changes of the visualized thoracolumbar spine.  IMPRESSION: 5 mm proximal right ureteral calculus with mild fullness of the right renal collecting system.  Two nonobstructing left renal  calculi measuring up to 3 mm.  Normal appendix.  No evidence of bowel obstruction.  Original Report Authenticated By: Charline Bills, M.D.     1. Kidney stone on right side       MDM  No hernias on exam, normal appearing scrotum testicles and abdominal exam. Vital signs are normal, urinalysis that accompanies the patient appears cloudy and dark, rule out infection, kidney stone. The patient appears comfortable and does not appear to be in any colicky pain. Toradol given all urinalysis is pending. Check renal function as well  Pt has had improvement with Toradol.  No fever or tachycardia on VS Filed Vitals:   02/08/12 0605  BP: 131/91  Pulse: 91  Temp: 97.1 F (36.2 C)  Resp: 18    CT reviewed and interpreted by myself - in agreement with radiology - has 5mm stone in the proximal R ureter with mild hydronephrosis.  Renal function normal, no leukocytosis and no infection in UA.    Pt  referred to Urology - home with meds - he has expressed undderstanding to his disease process and treatment protocol as well as indications for return.  Discharge Prescriptions include:  Naprosyn Hydrocodone Phenergan flomax      Vida Roller, MD 02/08/12 [phone]

## 2012-02-08 NOTE — ED Notes (Signed)
Pt c/o episodes of blood in urine x 1 week. States he began having right flank pain, nausea and vomiting today. Also reports h/o kidney stones. States symptoms similar to previous kidney stones. Lab at bedside. Pt resting denies further needs at this time

## 2012-02-08 NOTE — ED Notes (Signed)
Pt given discharge and follow up instructions after speaking with provider. Denies further needs at this time. Ambulates to lobby in NAD  

## 2012-02-12 ENCOUNTER — Encounter (HOSPITAL_COMMUNITY): Payer: Self-pay | Admitting: Emergency Medicine

## 2012-02-12 ENCOUNTER — Emergency Department (HOSPITAL_COMMUNITY)
Admission: EM | Admit: 2012-02-12 | Discharge: 2012-02-12 | Disposition: A | Discharge status: Home / Self Care | Payer: Self-pay | Attending: Emergency Medicine | Admitting: Emergency Medicine

## 2012-02-12 ENCOUNTER — Emergency Department (HOSPITAL_COMMUNITY): Payer: Self-pay

## 2012-02-12 DIAGNOSIS — N200 Calculus of kidney: Secondary | ICD-10-CM | POA: Insufficient documentation

## 2012-02-12 DIAGNOSIS — Z79899 Other long term (current) drug therapy: Secondary | ICD-10-CM | POA: Insufficient documentation

## 2012-02-12 DIAGNOSIS — Z8739 Personal history of other diseases of the musculoskeletal system and connective tissue: Secondary | ICD-10-CM | POA: Insufficient documentation

## 2012-02-12 DIAGNOSIS — Z7982 Long term (current) use of aspirin: Secondary | ICD-10-CM | POA: Insufficient documentation

## 2012-02-12 DIAGNOSIS — K219 Gastro-esophageal reflux disease without esophagitis: Secondary | ICD-10-CM | POA: Insufficient documentation

## 2012-02-12 LAB — URINALYSIS, ROUTINE W REFLEX MICROSCOPIC
Nitrite: NEGATIVE
Specific Gravity, Urine: 1.024 (ref 1.005–1.030)
pH: 6 (ref 5.0–8.0)

## 2012-02-12 LAB — URINE MICROSCOPIC-ADD ON

## 2012-02-12 MED ORDER — OXYCODONE-ACETAMINOPHEN 7.5-325 MG PO TABS: 1.0000 | ORAL_TABLET | ORAL | Status: AC | PRN

## 2012-02-12 MED ORDER — SODIUM CHLORIDE 0.9 % IJ SOLN
3.0000 mL | INTRAMUSCULAR | Status: AC
  Administered 2012-02-12: 3 mL via INTRAVENOUS

## 2012-02-12 MED ORDER — FENTANYL CITRATE 0.05 MG/ML IJ SOLN
50.0000 ug | INTRAMUSCULAR | Status: AC
  Administered 2012-02-12: 50 ug via INTRAVENOUS
  Filled 2012-02-12: qty 2

## 2012-02-12 NOTE — ED Provider Notes (Signed)
History     CSN: [id number]  Arrival date & time 02/12/12  1009   First MD Initiated Contact with Patient 02/12/12 1032      Chief Complaint  Patient presents with  . Nephrolithiasis     HPI Pt presenting to ed with c/o 4mm kidney stone pt states seen [facility] and [facility] on Friday pt states he was told to present back to ed if pain does not stop.  Patient denies fever or chills.  Patient has long history of numerous kidney stones.  Past Medical History  Diagnosis Date  . Arthritis   . Headache   . GERD (gastroesophageal reflux disease)   . Kidney stones     Past Surgical History  Procedure Date  . Anterior cervical decomp/discectomy fusion 2012    c5 - c7    Family History  Problem Relation Age of Onset  . Heart disease Maternal Grandfather   . Diabetes Maternal Grandfather     History  Substance Use Topics  . Smoking status: Never Smoker   . Smokeless tobacco: Never Used  . Alcohol Use: Yes     occassionally      Review of Systems  All other systems reviewed and are negative.    Allergies  Centrum; Doxycycline; Septra; and Ivp dye  Home Medications   Current Outpatient Rx  Name Route Sig Dispense Refill  . ASPIRIN-ACETAMINOPHEN-CAFFEINE 250-250-65 MG PO TABS Oral Take 1 tablet by mouth every 6 (six) hours as needed. For headache    . ATENOLOL 25 MG PO TABS Oral Take 100 mg by mouth daily. Depending on blood pressure    . DULOXETINE HCL 30 MG PO CPEP Oral Take 30 mg by mouth daily.    Marland Kitchen LORAZEPAM 0.5 MG PO TABS Oral Take 0.5 mg by mouth 2 (two) times daily as needed. For panic attacks    . NAPROXEN 500 MG PO TABS Oral Take 500 mg by mouth 2 (two) times daily with a meal.    . PROMETHAZINE HCL 25 MG PO TABS Oral Take 25 mg by mouth every 6 (six) hours as needed.    Marland Kitchen TAMSULOSIN HCL 0.4 MG PO CAPS Oral Take 0.4 mg by mouth 2 (two) times daily.    . OXYCODONE-ACETAMINOPHEN 7.5-325 MG PO TABS Oral Take 1 tablet by mouth every 4  (four) hours as needed for pain. 30 tablet 0    BP 150/83  Pulse 64  Temp 97.6 F (36.4 C) (Oral)  Resp 17  SpO2 96%  Physical Exam  Nursing note and vitals reviewed. Constitutional: He is oriented to person, place, and time. He appears well-developed. No distress.  HENT:  Head: Normocephalic and atraumatic.  Eyes: Pupils are equal, round, and reactive to light.  Neck: Normal range of motion.  Cardiovascular: Normal rate and intact distal pulses.   Pulmonary/Chest: No respiratory distress.  Abdominal: Normal appearance. He exhibits no distension. There is no tenderness.  Musculoskeletal: Normal range of motion.  Neurological: He is alert and oriented to person, place, and time. No cranial nerve deficit.  Skin: Skin is warm and dry. No rash noted.  Psychiatric: He has a normal mood and affect. His behavior is normal.    ED Course  Procedures (including critical care time) Scheduled Meds:   . fentaNYL  50 mcg Intravenous STAT  . sodium chloride  3 mL Intravenous STAT   Continuous Infusions:  PRN Meds:.  Labs Reviewed  URINALYSIS, ROUTINE W REFLEX MICROSCOPIC -  Abnormal; Notable for the following:    APPearance CLOUDY (*)     Hgb urine dipstick LARGE (*)     Protein, ur 30 (*)     Leukocytes, UA TRACE (*)     All other components within normal limits  URINE MICROSCOPIC-ADD ON - Abnormal; Notable for the following:    Squamous Epithelial / LPF FEW (*)     All other components within normal limits   Dg Abd 1 View  02/12/2012  *RADIOLOGY REPORT*  Clinical Data: Right abdominal pain  ABDOMEN - 1 VIEW  Comparison: [facility] radiograph dated 02/09/2012.  [facility] CT abdomen/pelvis dated 02/08/2012.  Findings: 5 mm calcification overlying the right sacrum, suspicious for distal ureteral calculus.  Nonobstructive bowel gas pattern.  Visualized osseous structures are within normal limits.  IMPRESSION: Suspected 5 mm calculus in the right sacral ureter.  Original Report  Authenticated By: Charline Bills, M.D.     1. Kidney stone       MDM  I spoke with the urologist.  He requests the patient see him tomorrow.  Patient discharged in minimal pain.  We'll plan on switching from hydrocodone to oxycodone.       Nelia Shi, MD 02/12/12 [phone]

## 2012-02-12 NOTE — ED Notes (Signed)
Jaymison Luber (wife)  [phone]

## 2012-02-12 NOTE — ED Notes (Signed)
Pt presenting to ed with c/o 4mm kidney stone pt states seen at [facility] Thursday and [facility] on Friday pt states he was told to present back to ed if pain does not stop.

## 2012-02-13 ENCOUNTER — Other Ambulatory Visit: Payer: Self-pay | Admitting: Urology

## 2012-02-14 ENCOUNTER — Encounter (HOSPITAL_COMMUNITY): Payer: Self-pay | Admitting: Pharmacy Technician

## 2012-02-14 ENCOUNTER — Encounter (HOSPITAL_COMMUNITY): Payer: Self-pay | Admitting: *Deleted

## 2012-02-15 ENCOUNTER — Encounter (HOSPITAL_COMMUNITY)
Admission: RE | Disposition: A | Discharge status: Home / Self Care | Payer: Self-pay | Source: Ambulatory Visit | Attending: Urology

## 2012-02-15 ENCOUNTER — Ambulatory Visit (HOSPITAL_COMMUNITY)
Admission: RE | Admit: 2012-02-15 | Discharge: 2012-02-15 | Disposition: A | Discharge status: Home / Self Care | Payer: Self-pay | Source: Ambulatory Visit | Attending: Urology | Admitting: Urology

## 2012-02-15 ENCOUNTER — Encounter (HOSPITAL_COMMUNITY): Payer: Self-pay | Admitting: *Deleted

## 2012-02-15 ENCOUNTER — Ambulatory Visit (HOSPITAL_COMMUNITY): Payer: Self-pay

## 2012-02-15 DIAGNOSIS — R12 Heartburn: Secondary | ICD-10-CM | POA: Insufficient documentation

## 2012-02-15 DIAGNOSIS — N1330 Unspecified hydronephrosis: Secondary | ICD-10-CM | POA: Insufficient documentation

## 2012-02-15 DIAGNOSIS — I10 Essential (primary) hypertension: Secondary | ICD-10-CM | POA: Insufficient documentation

## 2012-02-15 DIAGNOSIS — Z79899 Other long term (current) drug therapy: Secondary | ICD-10-CM | POA: Insufficient documentation

## 2012-02-15 DIAGNOSIS — N201 Calculus of ureter: Secondary | ICD-10-CM | POA: Insufficient documentation

## 2012-02-15 HISTORY — DX: Anxiety disorder, unspecified: F41.9

## 2012-02-15 SURGERY — LITHOTRIPSY, ESWL
Anesthesia: LOCAL | Laterality: Right

## 2012-02-15 MED ORDER — DEXTROSE-NACL 5-0.45 % IV SOLN
INTRAVENOUS | Status: DC
  Administered 2012-02-15: 16:00:00 via INTRAVENOUS

## 2012-02-15 MED ORDER — CIPROFLOXACIN HCL 500 MG PO TABS
500.0000 mg | ORAL_TABLET | ORAL | Status: AC
  Administered 2012-02-15: 500 mg via ORAL
  Filled 2012-02-15: qty 1

## 2012-02-15 MED ORDER — DIPHENHYDRAMINE HCL 25 MG PO CAPS
25.0000 mg | ORAL_CAPSULE | ORAL | Status: AC
  Administered 2012-02-15: 25 mg via ORAL
  Filled 2012-02-15: qty 1

## 2012-02-15 MED ORDER — OXYCODONE-ACETAMINOPHEN 5-325 MG PO TABS
1.0000 | ORAL_TABLET | Freq: Once | ORAL | Status: AC
  Administered 2012-02-15: 1 via ORAL
  Filled 2012-02-15: qty 1

## 2012-02-15 MED ORDER — DIAZEPAM 5 MG PO TABS
10.0000 mg | ORAL_TABLET | ORAL | Status: AC
  Administered 2012-02-15: 10 mg via ORAL
  Filled 2012-02-15: qty 2

## 2012-02-15 NOTE — H&P (Signed)
History of Present Illness  Seth Edwards was seen in the ER on 7/25 for right sided abdominal pain.  CT scan revealed 2 non obstructing left renal calculi and a 5 mm proximal right ureteral calculus with mild hydronephrosis.  He was on Tamsulosin for medical expulsive therapy. He went back to the ER yesterday with severe right sided pain.  He was given analgesics and sent home.  He has not had any severe pain since.  He voids well.  Urinalysis shows 0-2 RBC's, 0-2 WBC's.   Past Medical History Problems  1. History of  Anxiety (Symptom) 300.00 2. History of  Depression 311 3. History of  Heartburn 787.1 4. History of  Nephrolithiasis V13.01  Surgical History Problems  1. History of  Neck Surgery  Current Meds 1. Atenolol TABS; Therapy: (Recorded:26Jul2013) to 2. Ativan TABS; Therapy: (Recorded:26Jul2013) to 3. Cymbalta 30 MG Oral Capsule Delayed Release Particles; Therapy: (Recorded:26Jul2013) to 4. Excedrin 250-250-65 MG TABS; Therapy: (Recorded:03Jun2009) to 5. Hydrocodone-Acetaminophen 7.5-325 MG Oral Tablet; TAKE 1 TO 2 TABLETS EVERY 4 TO 6  HOURS AS NEEDED FOR PAIN; Therapy: 26Jul2013 to (Evaluate:30Jul2013); Last Rx:26Jul2013 6. Naproxen TABS; Therapy: (Recorded:26Jul2013) to 7. Phenergan TABS; Therapy: (Recorded:26Jul2013) to 8. Tamsulosin HCl 0.4 MG Oral Capsule; TAKE 1 CAPSULE BY MOUTH  DAILY  Requested for:  26Jul2013; Last Rx:26Jul2013  Allergies Medication  1. Doxycycline Hyclate CAPS  Family History Problems  1. Maternal history of  Blood In Urine 2. Maternal grandfather's history of  Diabetes Mellitus V18.0 3. Maternal grandmother's history of  Emphysema 4. Family history of  Family Health Status - Mother's Age 55 5. Family history of  Family Health Status Number Of Children 1 daughter 62. Maternal grandfather's history of  Heart Disease V17.49 7. Maternal history of  Nephrolithiasis  Social History Problems  1. Alcohol Use 1 or less 2. Caffeine Use 6 glasses  of tea 3. Marital History - Divorced V61.03 4. Occupation: Truck Psychologist, occupational  5. History of  Tobacco Use  Review of Systems Genitourinary, constitutional, skin, eye, otolaryngeal, hematologic/lymphatic, cardiovascular, pulmonary, endocrine, musculoskeletal, gastrointestinal, neurological and psychiatric system(s) were reviewed and pertinent findings if present are noted.  Gastrointestinal: abdominal pain.    Vitals Vital Signs [Data Includes: Last 1 Day]  30Jul2013 01:51PM  Blood Pressure: 113 / 75 Temperature: 98.6 F Heart Rate: 71 Respiration: 18  Physical Exam Constitutional: Well nourished and well developed . No acute distress.  ENT:. The ears and nose are normal in appearance.  Neck: The appearance of the neck is normal and no neck mass is present.  Pulmonary: No respiratory distress and normal respiratory rhythm and effort.  Cardiovascular: Heart rate and rhythm are normal . No peripheral edema.  Abdomen: The abdomen is soft and nontender. No masses are palpated. No CVA tenderness. No hernias are palpable. No hepatosplenomegaly noted.  Rectal: The prostate exam was deferred.  Genitourinary: Examination of the penis demonstrates no discharge, no masses, no lesions and a normal meatus. The scrotum is without lesions. The right epididymis is palpably normal and non-tender. The left epididymis is palpably normal and non-tender. The right testis is non-tender and without masses. The left testis is non-tender and without masses.  Lymphatics: The femoral and inguinal nodes are not enlarged or tender.  Skin: Normal skin turgor, no visible rash and no visible skin lesions.  Neuro/Psych:. Mood and affect are appropriate.    Results/Data  I have independently reviewed the CT scan and the findings are as noted above.  Assessment Assessed  1. Ureteral Stone Right 592.1 2. Nephrolithiasis Of The Left Kidney 592.0  Plan  SL of right ureteral calculus.  The procedure, risks,  benefits were explained to the patient.  The risks include but are not limited to hemorrhage, renal or perirenal hematoma, steinstrasse, injury to adjacent organs.  He understands and wishes to proceed.   Signatures Electronically signed by : Su Grand, M.D.; Feb 13 2012  6:52PM

## 2012-02-15 NOTE — Op Note (Signed)
Refer to [facility] Op Note scanned in the chart 

## 2012-05-21 ENCOUNTER — Encounter: Payer: Self-pay | Admitting: Physical Medicine & Rehabilitation

## 2012-05-23 ENCOUNTER — Ambulatory Visit (HOSPITAL_BASED_OUTPATIENT_CLINIC_OR_DEPARTMENT_OTHER): Payer: Worker's Compensation | Admitting: Physical Medicine & Rehabilitation

## 2012-05-23 ENCOUNTER — Encounter: Payer: Worker's Compensation | Attending: Physical Medicine & Rehabilitation

## 2012-05-23 ENCOUNTER — Encounter: Payer: Self-pay | Admitting: Physical Medicine & Rehabilitation

## 2012-05-23 VITALS — BP 130/79 | HR 89 | Resp 14 | Ht 70.0 in | Wt 212.0 lb

## 2012-05-23 DIAGNOSIS — M5020 Other cervical disc displacement, unspecified cervical region: Secondary | ICD-10-CM | POA: Insufficient documentation

## 2012-05-23 DIAGNOSIS — M961 Postlaminectomy syndrome, not elsewhere classified: Secondary | ICD-10-CM

## 2012-05-23 DIAGNOSIS — Z5181 Encounter for therapeutic drug level monitoring: Secondary | ICD-10-CM

## 2012-05-23 DIAGNOSIS — M4802 Spinal stenosis, cervical region: Secondary | ICD-10-CM | POA: Insufficient documentation

## 2012-05-23 DIAGNOSIS — M542 Cervicalgia: Secondary | ICD-10-CM

## 2012-05-23 DIAGNOSIS — Z981 Arthrodesis status: Secondary | ICD-10-CM | POA: Insufficient documentation

## 2012-05-23 DIAGNOSIS — R51 Headache: Secondary | ICD-10-CM | POA: Insufficient documentation

## 2012-05-23 NOTE — Progress Notes (Signed)
Subjective:    Patient ID: Seth Edwards, male    DOB: 1971/06/25, 41 y.o.   MRN: [id number]  HPI Date of injury 07/14/2010 Patient saw a doctor at urgent care several days after injury. Medical records reviewed from [facility] orthopedics center as well as neurology office. Delivery of wine and beer 01/04/2011 C5-C7 ACDF Dr. Shon Baton Patient was referred from urgent care to physical medicine and rehabilitation Dr. Ethelene Hal feb 13th 2012, Medications prescribed Mobic and Vicodin. the patient was also referred to physical therapy at [facility] orthopedics.NDI pre op surgery 44-50, This was well taking Mobic and Vicodin. MRI cervical spine dated 10/07/2010 showed moderate left-sided paracentral disc protrusion resulting in mild to moderate left-sided cord compression at C5-C6 and moderate central can now stenosis C6-C7 other levels look good. Patient had pain with physical therapy prior to his surgery in June of 2012 physical therapy Notes from August of 2012 reviewed. Developed postoperative headaches. Was sent to neurology.Treated with Midrin with some improvements. EMG performed by neurology showed no significant abnormalities. Also recommended to see pain psychologist Dr. Zenda Alpers Postoperative CT of the cervical spine performed 11/28/2011 left posterior protrusion C4-C5 solid fusion at C5-C7 left C5 selective nerve root block recommended by Dr. Shon Baton and performed on 04/03/2012 no improvement after the injection. Tolerated Cymbalta 10 mg but had gastrointestinal symptoms with increase to 20 mg Pain Inventory Average Pain 6 Pain Right Now 5 My pain is burning, dull, tingling and aching  In the last 24 hours, has pain interfered with the following? General activity 5 Relation with others 10 Enjoyment of life 6 What TIME of day is your pain at its worst? morning Sleep (in general) Poor  Pain is worse with: standing and some activites Pain improves with: heat/ice, therapy/exercise,  medication and injections Relief from Meds: 4  Mobility walk without assistance how many minutes can you walk? 15 ability to climb steps?  yes do you drive?  yes  Function employed # of hrs/week 0 I need assistance with the following:  dressing and toileting  Neuro/Psych weakness tingling spasms depression anxiety  Prior Studies CT/MRI  Physicians involved in your care Any changes since last visit?  no   Family History  Problem Relation Age of Onset  . Heart disease Maternal Grandfather   . Diabetes Maternal Grandfather    History   Social History  . Marital Status: Married    Spouse Name: N/A    Number of Children: N/A  . Years of Education: N/A   Social History Main Topics  . Smoking status: Never Smoker   . Smokeless tobacco: Never Used  . Alcohol Use: Yes     Comment: occassionally  . Drug Use: No  . Sexually Active:    Other Topics Concern  . None   Social History Narrative  . None   Past Surgical History  Procedure Date  . Anterior cervical decomp/discectomy fusion 2012    c5 - c7   Past Medical History  Diagnosis Date  . Arthritis   . GERD (gastroesophageal reflux disease)   . Kidney stones   . Anxiety     panic attacks  . Headache     takes atenolol for headaches   There were no vitals taken for this visit.     Review of Systems  Constitutional: Positive for diaphoresis.  HENT: Positive for neck pain.   Gastrointestinal: Positive for diarrhea and constipation.  Musculoskeletal: Positive for myalgias and arthralgias.  Neurological: Positive for weakness.  Psychiatric/Behavioral: Positive  for dysphoric mood. The patient is nervous/anxious.   All other systems reviewed and are negative.       Objective:   Physical Exam  Nursing note and vitals reviewed. Constitutional: He is oriented to person, place, and time. He appears well-developed and well-nourished.  HENT:  Head: Normocephalic and atraumatic.  Eyes: Conjunctivae  normal and EOM are normal. Pupils are equal, round, and reactive to light.  Musculoskeletal:       Right shoulder: Normal.       Left shoulder: Normal.       Cervical back: He exhibits decreased range of motion. He exhibits no deformity and no spasm.  Neurological: He is alert and oriented to person, place, and time. He has normal strength. He displays no atrophy and no tremor. No cranial nerve deficit or sensory deficit. Coordination and gait normal.  Reflex Scores:      Tricep reflexes are 3+ on the right side and 3+ on the left side.      Bicep reflexes are 3+ on the right side and 3+ on the left side.      Brachioradialis reflexes are 3+ on the right side and 3+ on the left side.      Patellar reflexes are 3+ on the right side and 3+ on the left side.      Achilles reflexes are 3+ on the right side and 3+ on the left side. Psychiatric: His speech is normal. Judgment and thought content normal. His affect is blunt. He is slowed and withdrawn. Cognition and memory are normal.          Assessment & Plan:   1. Cervical postlaminectomy syndrome Status post ACDFC5-6-C6-7 01/04/2012 The patient has had conservative treatment options including physical therapy, cervical injection, narcotic and are narcotic medications. None of this has been particularly helpful for him. The patient has had gastrointestinal side effects with hydrocodone and oxycodone. Nevertheless he requests narcotic treatment and states that his previous physician did not want to continue treatment with narcotic analgesics. His opioid risk total score is elevated at 13. This is mainly due to extensive alcohol, illegal drug, and prescription drug abuse history from his sister. The score puts him at a greater than 90% risk of having misuse or abuse of narcotic analgesics when taken on a chronic basis.  Because of this elevated risk, I do not feel comfortable prescribing these medications on a long-term basis.   2. Cervical  herniated nucleus pulposus at C4-5 The patient has previously spoken to Dr. Shon Baton about the possibility of surgery and will do so again at a followup visit.     60 minutes were spent reviewing notes, examining patient, 30 minutes counseling and coordination of care. Also met with nurse case manager to discuss the findings This is consultation only. No treatments initiated.No followup appointment scheduled. Do concur with prior recommendations From Dr. Shon Baton for pain psychology evaluation  Continue neurology treatments for headaches.

## 2012-05-23 NOTE — Patient Instructions (Addendum)
I will Not schedule a followup appointment since the treatment options we discussed you have tried. Please followup with Dr. Shon Baton who will get a copy of my note.

## 2012-05-23 NOTE — Progress Notes (Signed)
  Subjective:    Patient ID: Seth Edwards, male    DOB: March 24, 1971, 41 y.o.   MRN: [id number]  HPI    Review of Systems     Objective:   Physical Exam        Assessment & Plan:

## 2014-04-17 ENCOUNTER — Emergency Department (HOSPITAL_COMMUNITY)
Admission: EM | Admit: 2014-04-17 | Discharge: 2014-04-17 | Disposition: A | Discharge status: Home / Self Care | Payer: Medicare Other | Attending: Emergency Medicine | Admitting: Emergency Medicine

## 2014-04-17 ENCOUNTER — Encounter (HOSPITAL_COMMUNITY): Payer: Self-pay | Admitting: Emergency Medicine

## 2014-04-17 DIAGNOSIS — Z8739 Personal history of other diseases of the musculoskeletal system and connective tissue: Secondary | ICD-10-CM | POA: Insufficient documentation

## 2014-04-17 DIAGNOSIS — R109 Unspecified abdominal pain: Secondary | ICD-10-CM | POA: Diagnosis present

## 2014-04-17 DIAGNOSIS — N1330 Unspecified hydronephrosis: Secondary | ICD-10-CM | POA: Diagnosis not present

## 2014-04-17 DIAGNOSIS — R319 Hematuria, unspecified: Secondary | ICD-10-CM

## 2014-04-17 DIAGNOSIS — F419 Anxiety disorder, unspecified: Secondary | ICD-10-CM | POA: Diagnosis not present

## 2014-04-17 DIAGNOSIS — Z8719 Personal history of other diseases of the digestive system: Secondary | ICD-10-CM | POA: Insufficient documentation

## 2014-04-17 DIAGNOSIS — Z79899 Other long term (current) drug therapy: Secondary | ICD-10-CM | POA: Diagnosis not present

## 2014-04-17 LAB — URINE MICROSCOPIC-ADD ON

## 2014-04-17 LAB — URINALYSIS, ROUTINE W REFLEX MICROSCOPIC
Bilirubin Urine: NEGATIVE
Glucose, UA: NEGATIVE mg/dL
KETONES UR: NEGATIVE mg/dL
Leukocytes, UA: NEGATIVE
NITRITE: NEGATIVE
Protein, ur: NEGATIVE mg/dL
Specific Gravity, Urine: 1.017 (ref 1.005–1.030)
Urobilinogen, UA: 0.2 mg/dL (ref 0.0–1.0)
pH: 5.5 (ref 5.0–8.0)

## 2014-04-17 MED ORDER — SODIUM CHLORIDE 0.9 % IV BOLUS (SEPSIS)
500.0000 mL | Freq: Once | INTRAVENOUS | Status: AC
  Administered 2014-04-17: 500 mL via INTRAVENOUS

## 2014-04-17 MED ORDER — KETOROLAC TROMETHAMINE 30 MG/ML IJ SOLN
30.0000 mg | Freq: Once | INTRAMUSCULAR | Status: AC
  Administered 2014-04-17: 30 mg via INTRAVENOUS
  Filled 2014-04-17: qty 1

## 2014-04-17 MED ORDER — OXYCODONE-ACETAMINOPHEN 5-325 MG PO TABS: 1.0000 | ORAL_TABLET | ORAL | Status: DC | PRN

## 2014-04-17 MED ORDER — MORPHINE SULFATE 4 MG/ML IJ SOLN
6.0000 mg | Freq: Once | INTRAMUSCULAR | Status: AC
  Administered 2014-04-17: 6 mg via INTRAVENOUS
  Filled 2014-04-17: qty 2

## 2014-04-17 MED ORDER — ONDANSETRON 4 MG PO TBDP: ORAL_TABLET | ORAL | Status: DC

## 2014-04-17 NOTE — ED Notes (Signed)
Patient aware that a urine sample is needed, urinal is at the bedside. 

## 2014-04-17 NOTE — ED Notes (Signed)
Pt with left flank pain since yesterday.  Pt has had nausea with no vomiting.  States he has hx of kidney stones and feels the same.  No blood in urine.  No fever.

## 2014-04-17 NOTE — ED Provider Notes (Signed)
CSN: [id number]     Arrival date & time 04/17/14  1003 History   First MD Initiated Contact with Patient 04/17/14 1024     Chief Complaint  Patient presents with  . Flank Pain     (Consider location/radiation/quality/duration/timing/severity/associated sxs/prior Treatment) HPI Comments: 43 year old male with history of reflux, spinal stenosis cervical presents with left flank pain for the past 3 days. Intermittent sharp an ache sensation, severe at times. This is similar to multiple previous kidney stones most of which she passed on his own but has had procedures done in the past. Mild frequency. No fevers chills or vomiting. No abdominal surgery history  Patient is a 43 y.o. male presenting with flank pain. The history is provided by the patient.  Flank Pain Pertinent negatives include no chest pain, no abdominal pain, no headaches and no shortness of breath.    Past Medical History  Diagnosis Date  . Arthritis   . GERD (gastroesophageal reflux disease)   . Kidney stones   . Anxiety     panic attacks  . Headache(784.0)     takes atenolol for headaches   Past Surgical History  Procedure Laterality Date  . Anterior cervical decomp/discectomy fusion  2012    c5 - c7   Family History  Problem Relation Age of Onset  . Heart disease Maternal Grandfather   . Diabetes Maternal Grandfather    History  Substance Use Topics  . Smoking status: Never Smoker   . Smokeless tobacco: Never Used  . Alcohol Use: No    Review of Systems  Constitutional: Positive for appetite change. Negative for fever and chills.  HENT: Negative for congestion.   Eyes: Negative for visual disturbance.  Respiratory: Negative for shortness of breath.   Cardiovascular: Negative for chest pain.  Gastrointestinal: Negative for vomiting and abdominal pain.  Genitourinary: Positive for frequency and flank pain. Negative for dysuria.  Musculoskeletal: Negative for back pain, neck pain and neck stiffness.   Skin: Negative for rash.  Neurological: Negative for light-headedness and headaches.      Allergies  Doxycycline; Septra; and Ivp dye  Home Medications   Prior to Admission medications   Medication Sig Start Date End Date Taking? Authorizing Provider  ALPRAZolam Prudy Feeler) 0.5 MG tablet Take 0.5-1 mg by mouth 2 (two) times daily as needed for anxiety.   Yes Historical Provider, MD  QUEtiapine (SEROQUEL) 100 MG tablet Take 100 mg by mouth at bedtime.   Yes Historical Provider, MD  topiramate (TOPAMAX) 50 MG tablet Take 100 mg by mouth 2 (two) times daily.   Yes Historical Provider, MD  venlafaxine XR (EFFEXOR-XR) 75 MG 24 hr capsule Take 75 mg by mouth daily with breakfast.   Yes Historical Provider, MD  zolpidem (AMBIEN) 10 MG tablet Take 10 mg by mouth at bedtime as needed for sleep.   Yes Historical Provider, MD   BP 139/97  Pulse 95  Temp(Src) 97.8 F (36.6 C) (Oral)  Resp 18  SpO2 98% Physical Exam  Nursing note and vitals reviewed. Constitutional: He is oriented to person, place, and time. He appears well-developed and well-nourished.  HENT:  Head: Normocephalic and atraumatic.  Eyes: Conjunctivae are normal. Right eye exhibits no discharge. Left eye exhibits no discharge.  Neck: Normal range of motion. Neck supple. No tracheal deviation present.  Cardiovascular: Normal rate and regular rhythm.   Pulmonary/Chest: Effort normal and breath sounds normal.  Abdominal: Soft. He exhibits no distension. There is no tenderness. There is no guarding.  Musculoskeletal: He exhibits tenderness (very mild left flank). He exhibits no edema.  Neurological: He is alert and oriented to person, place, and time.  Skin: Skin is warm. No rash noted.  Psychiatric: He has a normal mood and affect.    ED Course  Procedures (including critical care time) Emergency Focused Ultrasound Exam Limited retroperitoneal ultrasound of kidneys  Performed and interpreted by Dr. Jodi MourningZavitz Indication: flank  pain Focused abdominal ultrasound with both kidneys imaged in transverse and longitudinal planes in real-time. Interpretation: mild left hydronephrosis visualized.   Images archived electronically  Labs Review Labs Reviewed  URINALYSIS, ROUTINE W REFLEX MICROSCOPIC - Abnormal; Notable for the following:    Color, Urine AMBER (*)    APPearance CLOUDY (*)    Hgb urine dipstick LARGE (*)    All other components within normal limits  URINE MICROSCOPIC-ADD ON - Abnormal; Notable for the following:    Crystals CA OXALATE CRYSTALS (*)    All other components within normal limits    Imaging Review No results found.   EKG Interpretation None      MDM   Final diagnoses:  Acute left flank pain  Hydronephrosis of left kidney   Clinically patient presents as kidney stone with history of kidney stones and similar presentation. Bedside ultrasound showed mild hydronephrosis. Plan for pain control, small bolus of IV fluids and reassessment. If able to control patient's pain and urinalysis no significant infection patient is comfortable holding on CT scan today as he has urology followup outpatient. Reasons to return discussed Patient's pain improved significantly on recheck 2/10. Patient comfortable with outpatient followup Results and differential diagnosis were discussed with the patient/parent/guardian. Close follow up outpatient was discussed, comfortable with the plan.   Medications  ketorolac (TORADOL) 30 MG/ML injection 30 mg (not administered)  morphine 4 MG/ML injection 6 mg (not administered)  sodium chloride 0.9 % bolus 500 mL (not administered)    Filed Vitals:   04/17/14 1015  BP: 139/97  Pulse: 95  Temp: 97.8 F (36.6 C)  TempSrc: Oral  Resp: 18  SpO2: 98%    Final diagnoses:  Acute left flank pain  Hydronephrosis of left kidney       Enid SkeensJoshua M Jesiel Garate, MD 04/17/14 1300

## 2014-04-17 NOTE — Discharge Instructions (Signed)
Strain urine. Stay well hydrated. If you were given medicines take as directed.  If you are on coumadin or contraceptives realize their levels and effectiveness is altered by many different medicines.  If you have any reaction (rash, tongues swelling, other) to the medicines stop taking and see a physician.   Please follow up as directed and return to the ER or see a physician for new or worsening symptoms.  Thank you. Filed Vitals:   04/17/14 1015  BP: 139/97  Pulse: 95  Temp: 97.8 F (36.6 C)  TempSrc: Oral  Resp: 18  SpO2: 98%

## 2014-04-18 ENCOUNTER — Emergency Department (HOSPITAL_COMMUNITY)
Admission: EM | Admit: 2014-04-18 | Discharge: 2014-04-18 | Disposition: A | Discharge status: Home / Self Care | Payer: Medicare Other | Attending: Emergency Medicine | Admitting: Emergency Medicine

## 2014-04-18 ENCOUNTER — Encounter (HOSPITAL_COMMUNITY): Payer: Self-pay | Admitting: Emergency Medicine

## 2014-04-18 ENCOUNTER — Emergency Department (HOSPITAL_COMMUNITY): Payer: Medicare Other

## 2014-04-18 DIAGNOSIS — R61 Generalized hyperhidrosis: Secondary | ICD-10-CM | POA: Insufficient documentation

## 2014-04-18 DIAGNOSIS — N200 Calculus of kidney: Secondary | ICD-10-CM | POA: Diagnosis not present

## 2014-04-18 DIAGNOSIS — M1990 Unspecified osteoarthritis, unspecified site: Secondary | ICD-10-CM | POA: Insufficient documentation

## 2014-04-18 DIAGNOSIS — Z79899 Other long term (current) drug therapy: Secondary | ICD-10-CM | POA: Insufficient documentation

## 2014-04-18 DIAGNOSIS — Z8719 Personal history of other diseases of the digestive system: Secondary | ICD-10-CM | POA: Diagnosis not present

## 2014-04-18 DIAGNOSIS — F419 Anxiety disorder, unspecified: Secondary | ICD-10-CM | POA: Insufficient documentation

## 2014-04-18 DIAGNOSIS — R109 Unspecified abdominal pain: Secondary | ICD-10-CM | POA: Diagnosis present

## 2014-04-18 LAB — URINE CULTURE: Colony Count: 10000

## 2014-04-18 LAB — BASIC METABOLIC PANEL
ANION GAP: 17 — AB (ref 5–15)
BUN: 9 mg/dL (ref 6–23)
CHLORIDE: 99 meq/L (ref 96–112)
CO2: 24 meq/L (ref 19–32)
Calcium: 10.5 mg/dL (ref 8.4–10.5)
Creatinine, Ser: 1.64 mg/dL — ABNORMAL HIGH (ref 0.50–1.35)
GFR calc Af Amer: 57 mL/min — ABNORMAL LOW (ref >90)
GFR calc non Af Amer: 49 mL/min — ABNORMAL LOW (ref >90)
Glucose, Bld: 118 mg/dL — ABNORMAL HIGH (ref 70–99)
Potassium: 4.1 mEq/L (ref 3.7–5.3)
Sodium: 140 mEq/L (ref 137–147)

## 2014-04-18 LAB — CBC
HCT: 47.1 % (ref 39.0–52.0)
HEMOGLOBIN: 16.7 g/dL (ref 13.0–17.0)
MCH: 30.5 pg (ref 26.0–34.0)
MCHC: 35.5 g/dL (ref 30.0–36.0)
MCV: 85.9 fL (ref 78.0–100.0)
Platelets: 281 10*3/uL (ref 150–400)
RBC: 5.48 MIL/uL (ref 4.22–5.81)
RDW: 11.7 % (ref 11.5–15.5)
WBC: 9.2 10*3/uL (ref 4.0–10.5)

## 2014-04-18 LAB — URINALYSIS, ROUTINE W REFLEX MICROSCOPIC
BILIRUBIN URINE: NEGATIVE
GLUCOSE, UA: NEGATIVE mg/dL
HGB URINE DIPSTICK: NEGATIVE
KETONES UR: 40 mg/dL — AB
Leukocytes, UA: NEGATIVE
Nitrite: NEGATIVE
PH: 8 (ref 5.0–8.0)
Protein, ur: 30 mg/dL — AB
Specific Gravity, Urine: 1.016 (ref 1.005–1.030)
Urobilinogen, UA: 0.2 mg/dL (ref 0.0–1.0)

## 2014-04-18 LAB — URINE MICROSCOPIC-ADD ON: Urine-Other: NONE SEEN

## 2014-04-18 MED ORDER — PROMETHAZINE HCL 12.5 MG PO TABS: 12.5000 mg | ORAL_TABLET | Freq: Four times a day (QID) | ORAL | Status: DC | PRN

## 2014-04-18 MED ORDER — HYDROMORPHONE HCL 1 MG/ML IJ SOLN
1.0000 mg | Freq: Once | INTRAMUSCULAR | Status: AC
  Administered 2014-04-18: 1 mg via INTRAVENOUS
  Filled 2014-04-18: qty 1

## 2014-04-18 MED ORDER — KETOROLAC TROMETHAMINE 30 MG/ML IJ SOLN
30.0000 mg | Freq: Once | INTRAMUSCULAR | Status: AC
  Administered 2014-04-18: 30 mg via INTRAVENOUS
  Filled 2014-04-18: qty 1

## 2014-04-18 MED ORDER — OXYCODONE-ACETAMINOPHEN 10-325 MG PO TABS: 1.0000 | ORAL_TABLET | ORAL | Status: DC | PRN

## 2014-04-18 MED ORDER — TAMSULOSIN HCL 0.4 MG PO CAPS: 0.4000 mg | ORAL_CAPSULE | Freq: Two times a day (BID) | ORAL | Status: DC

## 2014-04-18 NOTE — ED Notes (Signed)
Pt presents with c/o left flank pain. Pt was seen yesterday and diagnosed with a kidney stone, discharged with pain medicine. Pt and wife report that the pain medicine he was given has not touched the pain and he has not been able to sleep. Pt denies any blood in his urine at this time but reports pain with urination and is not passing a full stream of urine when he urinates.

## 2014-04-18 NOTE — Discharge Instructions (Signed)
Your exam and or your xrays have shown that you likely have a kidney stone.  You should follow up with the Urologist of your choosing or the Urologist listed above in the next 2-3 days if you have not passed the stone.  You should urinate in to the strainer until you pass the stone.    Flomax helps with passing the stone by opening up the Ureters (tubes), Vicodin and an antiinflammatory for pain if you are not allergic to these medicines.  Phenergan or Zofran for nausea.  Return to the ER for severe or worsening pain, vomiting or fevers or if you are unable to control your pain with the medicines provided.  Kidney Stones Kidney stones (ureteral lithiasis) are deposits that form inside your kidneys. The intense pain is caused by the stone moving through the urinary tract. When the stone moves, the ureter goes into spasm around the stone. The stone is usually passed in the urine.  CAUSES  A disorder that makes certain neck glands produce too much parathyroid hormone (primary hyperparathyroidism).  A buildup of uric acid crystals.  Narrowing (stricture) of the ureter.  A kidney obstruction present at birth (congenital obstruction).  Previous surgery on the kidney or ureters.  Numerous kidney infections.  SYMPTOMS  Feeling sick to your stomach (nauseous).  Throwing up (vomiting).  Blood in the urine (hematuria).  Pain that usually spreads (radiates) to the groin.  Frequency or urgency of urination.  DIAGNOSIS  Taking a history and physical exam.  Blood or urine tests.  Computerized X-ray scan (CT scan).  Occasionally, an examination of the inside of the urinary bladder (cystoscopy) is performed.  TREATMENT  Observation.  Increasing your fluid intake.  Surgery may be needed if you have severe pain or persistent obstruction.  The size, location, and chemical composition are all important variables that will determine the proper choice of action for you. Talk to your caregiver to better  understand your situation so that you will minimize the risk of injury to yourself and your kidney.  HOME CARE INSTRUCTIONS  Drink enough water and fluids to keep your urine clear or pale yellow.  Strain all urine through the provided strainer. Keep all particulate matter and stones for your caregiver to see. The stone causing the pain may be as small as a grain of salt. It is very important to use the strainer each and every time you pass your urine. The collection of your stone will allow your caregiver to analyze it and verify that a stone has actually passed.  Only take over-the-counter or prescription medicines for pain, discomfort, or fever as directed by your caregiver.  Make a follow-up appointment with your caregiver as directed.  Get follow-up X-rays if required. The absence of pain does not always mean that the stone has passed. It may have only stopped moving. If the urine remains completely obstructed, it can cause loss of kidney function or even complete destruction of the kidney. It is your responsibility to make sure X-rays and follow-ups are completed. Ultrasounds of the kidney can show blockages and the status of the kidney. Ultrasounds are not associated with any radiation and can be performed easily in a matter of minutes.  SEEK IMMEDIATE MEDICAL CARE IF:  Pain cannot be controlled with the prescribed medicine.  You have a fever.  The severity or intensity of pain increases over 18 hours and is not relieved by pain medicine.  You develop a new onset of abdominal pain.  You   feel faint or pass out.  MAKE SURE YOU:  Understand these instructions.  Will watch your condition.  Will get help right away if you are not doing well or get worse.  Document Released: 07/03/2005 Document Revised: 06/22/2011 Document Reviewed: 10/29/2009 ExitCare Patient Information 2012 [employer]  RESOURCE GUIDE  Chronic Pain Problems: Contact [facility]  [phone] Patients  need to be referred by their primary care doctor.  Insufficient Money for Medicine: Contact United Way:  call "211" or [facility] 271-5999.  No Primary Care Doctor: Call [facility]  [phone] - can help you locate a primary care doctor that  accepts your insurance, provides certain services, etc. Physician Referral Service- 1-800-533-3463  Agencies that provide inexpensive medical care: [facility]  [phone] [facility]  [phone] [facility]  [phone] [facility]  [phone] [facility]  [phone] [facility]  [phone]  Medicaid-accepting [place] Providers: [facility]- [address]  [phone], Mon-Fri 9am-7pm, Sat 9am-1pm [facility]- [address], [address]  [phone] [facility]- [address], [address]  [phone] [facility]- [address] [place] [address]  [phone] [facility]- [address], [address], [phone]  Only accepts [place] Access Medicaid patients after they have their name  applied to their card  Self Pay (no insurance) in [place]: Sickle Cell Patients: Dr Eric Dean, [facility]  [address], [phone] [facility]- [address]  [phone]       -     [facility] [place]- [address]       -     [facility]- see information above (Speak to Pam H if you do not have insurance)       -  [facility]- [address], [phone]       -  [facility] [place]- [address],  [phone]       -  [facility]- [address] [place] [address], [phone]       -  Dr Osei-Bonsu-  [address], [place], [phone]       -  [facility]- [address], [phone]       -  [facility]- [address] [place] [address], [phone], also [address], [phone]        -    [facility]- [address], [phone], 1st & 3rd Saturday   every month, 10am-1pm  1) Find a Doctor and Pay Out of Pocket Although you won't have to find out who is covered by your insurance plan, it is a good idea to ask around and get recommendations. You will then need to call the office and see if the doctor you have chosen will accept you as a new patient and what types of options they offer for patients who are self-pay. Some doctors offer discounts or will set up payment plans for their patients who do not have insurance, but you will need to ask so you aren't surprised when you get to your appointment.  2) Contact Your Local Health Department Not all health departments have doctors that can see patients for sick visits, but many do, so it is worth a call to see if yours does. If   you don't know where your local health department is, you can check in your phone book. The CDC also has a tool to help you locate your state's health department, and many state websites also have listings of all of their local health departments.  3) Find a Walk-in Clinic If your illness is not likely to be very severe or complicated, you may want to try a walk in clinic. These are popping up all over the country in pharmacies, drugstores, and shopping centers. They're usually staffed by nurse practitioners or physician assistants that have been trained to treat common illnesses and complaints. They're usually fairly quick and inexpensive. However, if you have serious medical issues or chronic medical problems, these are probably not your best option  STD Testing [place] [facility] [place], [facility], [address], [place], phone [phone] or [phone].  Monday - Friday, call for an appointment. [place] [facility] [place], [facility], [address], [place], phone [phone] or 1-877-539-9860.  Monday - Friday, call for an  appointment.  Abuse/Neglect: [place] Child Abuse Hotline (336) 641-3795 [place] Child Abuse Hotline 800-378-5315 (After Hours)  Emergency Shelter:  [facility] (336) 271-5985  Maternity Homes: [facility] (336) 275-9566 [facility] (704) 372-4663  MRSA Hotline #:   832-7006  [place] Resources  [facility] [place]  [employer] [place] [facility] [address]                 [address]         [address]  [place]                                               [place]                              [place] Phone:  [phone]                                  Phone:  [phone]                   Phone:  [phone]  [place] [facility], [phone] [place] [facility] - [facility]- 1-888-581-9988       -     [facility] in , [address],                                  336-349-4454, Insurance  [place] Child Abuse Hotline (336) 342-1394 or (336) 342-3537 (After Hours)   Behavioral Health Services  Substance Abuse Resources: [facility]  336-882-2125 [facility] 336-784-9470 [facility] 336-285-9073 [facility] 336-845-3988 Residential & Outpatient Substance Abuse Program  800-659-3381  Psychological Services: [facility]  832-9600 [facility]  [phone] [facility] [facility], [address], [place], ACCESS LINE: 1-800-853-5163 or 336-641-4981, Http://www.[facility].com/services/adult.htm  Dental Assistance  If unable to pay or uninsured, contact:  [facility] or [place] [facility] to become qualified for the adult dental clinic.  Patients   with Medicaid: [facility] [address], [phone], [phone]  If unable to pay, or uninsured, contact  [facility] (271-5999) or [place] [facility] ([phone] in [place], [phone] in [place]) to become qualified for the adult dental clinic  Other Low-Cost Community Dental Services: [facility]- [address], [place], [place], [address], [phone], [address]. [address], 2nd and 4th Thursday of the month at 6:30am.  10 clients each day by appointment, can sometimes see walk-in patients if someone does not show for an appointment. [facility]- [address], [place], [place], [address], [phone] [facility]- [address], [place], [place], [address], [phone] [place] [facility]- [phone] [facility]- [phone] [facility]- [phone]      

## 2014-04-18 NOTE — ED Notes (Signed)
Bed: WA18 Expected date:  Expected time:  Means of arrival:  Comments: 

## 2014-04-18 NOTE — ED Provider Notes (Signed)
CSN: [id number]     Arrival date & time 04/18/14  1750 History   First MD Initiated Contact with Patient 04/18/14 1804     Chief Complaint  Patient presents with  . Flank Pain     (Consider location/radiation/quality/duration/timing/severity/associated sxs/prior Treatment) HPI Comments: The patient is a 43 year old male with a history of multiple kidney stones in the past who presents with ongoing flank pain. This is located in the left flank, it has not changed in location, it does seem to fluctuate in intensity but the pain has been present most of the last 24 hours since he was seen in the emergency department and a bedside ultrasound reveals a mild hydronephrosis. Since that time the patient has been nauseated, mildly diaphoretic, unable to sleep because of the pain and the medications they were prescribed including Percocet was not having its normal affect. He denies any other symptoms including diarrhea, fever, cough, shortness of breath. Nothing makes this pain better or worse, it is not positional  Patient is a 43 y.o. male presenting with flank pain. The history is provided by the patient.  Flank Pain    Past Medical History  Diagnosis Date  . Arthritis   . GERD (gastroesophageal reflux disease)   . Kidney stones   . Anxiety     panic attacks  . Headache(784.0)     takes atenolol for headaches   Past Surgical History  Procedure Laterality Date  . Anterior cervical decomp/discectomy fusion  2012    c5 - c7   Family History  Problem Relation Age of Onset  . Heart disease Maternal Grandfather   . Diabetes Maternal Grandfather    History  Substance Use Topics  . Smoking status: Never Smoker   . Smokeless tobacco: Never Used  . Alcohol Use: No    Review of Systems  Genitourinary: Positive for flank pain.  All other systems reviewed and are negative.     Allergies  Doxycycline; Septra; and Ivp dye  Home Medications   Prior to Admission medications    Medication Sig Start Date End Date Taking? Authorizing Provider  ALPRAZolam Prudy Feeler(XANAX) 0.5 MG tablet Take 0.5-1 mg by mouth 2 (two) times daily as needed for anxiety.   Yes Historical Provider, MD  oxyCODONE-acetaminophen (PERCOCET/ROXICET) 5-325 MG per tablet Take 1 tablet by mouth every 4 (four) hours as needed for severe pain.   Yes Historical Provider, MD  QUEtiapine (SEROQUEL) 100 MG tablet Take 100 mg by mouth at bedtime.   Yes Historical Provider, MD  topiramate (TOPAMAX) 50 MG tablet Take 100 mg by mouth 2 (two) times daily.   Yes Historical Provider, MD  venlafaxine XR (EFFEXOR-XR) 75 MG 24 hr capsule Take 75 mg by mouth daily with breakfast.   Yes Historical Provider, MD  zolpidem (AMBIEN) 10 MG tablet Take 10 mg by mouth at bedtime as needed for sleep.   Yes Historical Provider, MD  oxyCODONE-acetaminophen (PERCOCET) 10-325 MG per tablet Take 1 tablet by mouth every 4 (four) hours as needed for pain. 04/18/14   Vida RollerBrian D Vania Rosero, MD  promethazine (PHENERGAN) 12.5 MG tablet Take 1 tablet (12.5 mg total) by mouth every 6 (six) hours as needed for nausea or vomiting. 04/18/14   Vida RollerBrian D Treshon Stannard, MD  tamsulosin (FLOMAX) 0.4 MG CAPS capsule Take 1 capsule (0.4 mg total) by mouth 2 (two) times daily. 04/18/14   Vida RollerBrian D Chapman Matteucci, MD   BP 144/97  Pulse 96  Temp(Src) 98.3 F (36.8 C) (Oral)  Resp 18  SpO2 96% Physical Exam  Nursing note and vitals reviewed. Constitutional: He appears well-developed and well-nourished. No distress.  HENT:  Head: Normocephalic and atraumatic.  Mouth/Throat: Oropharynx is clear and moist. No oropharyngeal exudate.  Eyes: Conjunctivae and EOM are normal. Pupils are equal, round, and reactive to light. Right eye exhibits no discharge. Left eye exhibits no discharge. No scleral icterus.  Neck: Normal range of motion. Neck supple. No JVD present. No thyromegaly present.  Cardiovascular: Normal rate, regular rhythm, normal heart sounds and intact distal pulses.  Exam reveals  no gallop and no friction rub.   No murmur heard. Pulmonary/Chest: Effort normal and breath sounds normal. No respiratory distress. He has no wheezes. He has no rales.  Abdominal: Soft. Bowel sounds are normal. He exhibits no distension and no mass. There is no tenderness.  No reproducible abdominal tenderness on exam, mild CVA tenderness on the left  Musculoskeletal: Normal range of motion. He exhibits no edema and no tenderness.  Lymphadenopathy:    He has no cervical adenopathy.  Neurological: He is alert. Coordination normal.  Skin: Skin is warm. No rash noted. He is diaphoretic (mild diaphoresis). No erythema.  Psychiatric: He has a normal mood and affect. His behavior is normal.    ED Course  Procedures (including critical care time) Labs Review Labs Reviewed  URINALYSIS, ROUTINE W REFLEX MICROSCOPIC - Abnormal; Notable for the following:    Ketones, ur 40 (*)    Protein, ur 30 (*)    All other components within normal limits  BASIC METABOLIC PANEL - Abnormal; Notable for the following:    Glucose, Bld 118 (*)    Creatinine, Ser 1.64 (*)    GFR calc non Af Amer 49 (*)    GFR calc Af Amer 57 (*)    Anion gap 17 (*)    All other components within normal limits  CBC  URINE MICROSCOPIC-ADD ON    Imaging Review Ct Abdomen Pelvis Wo Contrast  04/18/2014   CLINICAL DATA:  Left flank pain.  EXAM: CT ABDOMEN AND PELVIS WITHOUT CONTRAST  TECHNIQUE: Multidetector CT imaging of the abdomen and pelvis was performed following the standard protocol without IV contrast.  COMPARISON:  CT 02/08/2012  FINDINGS: Visualization of the lower thorax demonstrates no consolidative or nodular pulmonary opacities. Normal heart size.  Lack of intravenous contrast material limits evaluation of the solid organ parenchyma.  Noncontrast enhanced liver, gallbladder, spleen, pancreas and bilateral adrenal glands are unremarkable.  There is moderate left hydroureteronephrosis to the distal left ureter were there  is a nonobstructing 4 mm calculus (image 74; series 2). Left perinephric fat stranding. There is an additional 6 mm nonobstructing stone within the interpolar region of the left kidney and a 3 mm nonobstructing stone within the inferior pole of the left kidney. The right kidney is unremarkable. The  Normal caliber abdominal aorta. No retroperitoneal lymphadenopathy. Urinary bladder is unremarkable. Prostate unremarkable.  No abnormal bowel wall thickening or evidence for bowel obstruction. Appendix is normal. No free intraperitoneal air.  No aggressive or acute appearing osseous lesions.  IMPRESSION: Obstructing 4 mm distal left ureteral stone with moderate left hydroureteronephrosis.  Additional nonobstructing stone within the interpolar region and inferior pole of the left kidney.   Electronically Signed   By: Annia Belt M.D.   On: 04/18/2014 19:16     EKG Interpretation None      MDM   Final diagnoses:  Kidney stone    , This is suspicious for a large  kidney stone, CT scan ordered to evaluate the size, labs otherwise ordered, vital signs show mild tachycardia. I personally placed a peripheral IV.   Emergency Focused Ultrasound Exam Limited retroperitoneal ultrasound of kidneys  Performed and interpreted by Dr. Hyacinth Meeker Indication: flank pain Focused abdominal ultrasound with both kidneys imaged in transverse and longitudinal planes in real-time. Interpretation: Mild L hydronephrosis visualized.   Images archived electronically   Angiocath insertion Performed by: Vida Roller  Consent: Verbal consent obtained. Risks and benefits: risks, benefits and alternatives were discussed Time out: Immediately prior to procedure a "time out" was called to verify the correct patient, procedure, equipment, support staff and site/side marked as required.  Preparation: Patient was prepped and draped in the usual sterile fashion.  Vein Location: L AC  Not Ultrasound Guided  Gauge:  20  Normal blood return and flush without difficulty Patient tolerance: Patient tolerated the procedure well with no immediate complications.     He improved after medications, creatinine slightly increased, this was discussed with the urologist who agrees that the patient can be safely discharged, distal ureteral stone, Flomax, home.  Meds given in ED:  Medications  ketorolac (TORADOL) 30 MG/ML injection 30 mg (30 mg Intravenous Given 04/18/14 1828)  HYDROmorphone (DILAUDID) injection 1 mg (1 mg Intravenous Given 04/18/14 1828)    New Prescriptions   OXYCODONE-ACETAMINOPHEN (PERCOCET) 10-325 MG PER TABLET    Take 1 tablet by mouth every 4 (four) hours as needed for pain.   PROMETHAZINE (PHENERGAN) 12.5 MG TABLET    Take 1 tablet (12.5 mg total) by mouth every 6 (six) hours as needed for nausea or vomiting.   TAMSULOSIN (FLOMAX) 0.4 MG CAPS CAPSULE    Take 1 capsule (0.4 mg total) by mouth 2 (two) times daily.      Vida Roller, MD 04/18/14 2107

## 2014-04-22 ENCOUNTER — Encounter (HOSPITAL_COMMUNITY): Payer: Self-pay | Admitting: Emergency Medicine

## 2014-04-22 ENCOUNTER — Emergency Department (HOSPITAL_COMMUNITY)
Admission: EM | Admit: 2014-04-22 | Discharge: 2014-04-22 | Discharge status: Against Medical Advice | Payer: Medicare Other | Attending: Emergency Medicine | Admitting: Emergency Medicine

## 2014-04-22 DIAGNOSIS — R109 Unspecified abdominal pain: Secondary | ICD-10-CM | POA: Diagnosis present

## 2014-04-22 NOTE — ED Notes (Signed)
Pt c/o L flank pain onset @ 10/1, pt has known stone but has been trying to pass stone himself. Pain is not under control. Denies n/v today.

## 2014-04-22 NOTE — ED Notes (Signed)
Patient called to treatment room x3, no answer. LWBS after triage.

## 2014-04-24 ENCOUNTER — Ambulatory Visit (HOSPITAL_COMMUNITY)
Admission: EM | Admit: 2014-04-24 | Discharge: 2014-04-25 | Disposition: A | Discharge status: Home / Self Care | Payer: Medicare Other | Attending: Urology | Admitting: Urology

## 2014-04-24 ENCOUNTER — Encounter (HOSPITAL_COMMUNITY): Payer: Self-pay | Admitting: Emergency Medicine

## 2014-04-24 DIAGNOSIS — Z882 Allergy status to sulfonamides status: Secondary | ICD-10-CM | POA: Insufficient documentation

## 2014-04-24 DIAGNOSIS — F419 Anxiety disorder, unspecified: Secondary | ICD-10-CM | POA: Diagnosis not present

## 2014-04-24 DIAGNOSIS — Z79899 Other long term (current) drug therapy: Secondary | ICD-10-CM | POA: Diagnosis not present

## 2014-04-24 DIAGNOSIS — N201 Calculus of ureter: Secondary | ICD-10-CM | POA: Insufficient documentation

## 2014-04-24 DIAGNOSIS — Z91041 Radiographic dye allergy status: Secondary | ICD-10-CM | POA: Insufficient documentation

## 2014-04-24 DIAGNOSIS — K219 Gastro-esophageal reflux disease without esophagitis: Secondary | ICD-10-CM | POA: Insufficient documentation

## 2014-04-24 DIAGNOSIS — N200 Calculus of kidney: Secondary | ICD-10-CM

## 2014-04-24 DIAGNOSIS — Z881 Allergy status to other antibiotic agents status: Secondary | ICD-10-CM | POA: Diagnosis not present

## 2014-04-24 LAB — URINE MICROSCOPIC-ADD ON

## 2014-04-24 LAB — URINALYSIS, ROUTINE W REFLEX MICROSCOPIC
Bilirubin Urine: NEGATIVE
GLUCOSE, UA: NEGATIVE mg/dL
Ketones, ur: 15 mg/dL — AB
Leukocytes, UA: NEGATIVE
Nitrite: NEGATIVE
Protein, ur: 30 mg/dL — AB
SPECIFIC GRAVITY, URINE: 1.025 (ref 1.005–1.030)
UROBILINOGEN UA: 1 mg/dL (ref 0.0–1.0)
pH: 7.5 (ref 5.0–8.0)

## 2014-04-24 NOTE — ED Notes (Signed)
Pt has been seen in the ED several times d/t left sided flank pain, US done last visit showing 4 mm stone per pt's wife.  Pt has not been able to get into the urologist and presents tonight d/t worsening pain.

## 2014-04-25 ENCOUNTER — Encounter (HOSPITAL_COMMUNITY): Payer: Self-pay | Admitting: Emergency Medicine

## 2014-04-25 ENCOUNTER — Ambulatory Visit: Admit: 2014-04-25 | Payer: Self-pay | Admitting: Urology

## 2014-04-25 ENCOUNTER — Emergency Department (HOSPITAL_COMMUNITY): Payer: Medicare Other | Admitting: Anesthesiology

## 2014-04-25 ENCOUNTER — Encounter (HOSPITAL_COMMUNITY)
Admission: EM | Disposition: A | Discharge status: Home / Self Care | Payer: Self-pay | Source: Home / Self Care | Attending: Emergency Medicine

## 2014-04-25 ENCOUNTER — Emergency Department (HOSPITAL_COMMUNITY): Payer: Medicare Other

## 2014-04-25 ENCOUNTER — Encounter (HOSPITAL_COMMUNITY): Payer: Medicare Other | Admitting: Anesthesiology

## 2014-04-25 DIAGNOSIS — Z882 Allergy status to sulfonamides status: Secondary | ICD-10-CM | POA: Diagnosis not present

## 2014-04-25 DIAGNOSIS — K219 Gastro-esophageal reflux disease without esophagitis: Secondary | ICD-10-CM | POA: Diagnosis not present

## 2014-04-25 DIAGNOSIS — F419 Anxiety disorder, unspecified: Secondary | ICD-10-CM | POA: Diagnosis not present

## 2014-04-25 DIAGNOSIS — N201 Calculus of ureter: Secondary | ICD-10-CM | POA: Diagnosis not present

## 2014-04-25 HISTORY — PX: CYSTOSCOPY/RETROGRADE/URETEROSCOPY/STONE EXTRACTION WITH BASKET: SHX5317

## 2014-04-25 LAB — SURGICAL PCR SCREEN
MRSA, PCR: NEGATIVE
STAPHYLOCOCCUS AUREUS: NEGATIVE

## 2014-04-25 SURGERY — CYSTOSCOPY, WITH CALCULUS REMOVAL USING BASKET
Anesthesia: General | Laterality: Left

## 2014-04-25 MED ORDER — KETOROLAC TROMETHAMINE 30 MG/ML IJ SOLN
30.0000 mg | Freq: Once | INTRAMUSCULAR | Status: AC
  Administered 2014-04-25: 30 mg via INTRAVENOUS
  Filled 2014-04-25: qty 1

## 2014-04-25 MED ORDER — PROPOFOL 10 MG/ML IV BOLUS
INTRAVENOUS | Status: DC | PRN
  Administered 2014-04-25: 200 mg via INTRAVENOUS

## 2014-04-25 MED ORDER — ACETAMINOPHEN 650 MG RE SUPP
650.0000 mg | RECTAL | Status: DC | PRN
  Filled 2014-04-25: qty 1

## 2014-04-25 MED ORDER — LACTATED RINGERS IV SOLN
INTRAVENOUS | Status: DC | PRN
  Administered 2014-04-25 (×2): via INTRAVENOUS

## 2014-04-25 MED ORDER — FENTANYL CITRATE 0.05 MG/ML IJ SOLN
INTRAMUSCULAR | Status: DC | PRN
  Administered 2014-04-25 (×4): 50 ug via INTRAVENOUS

## 2014-04-25 MED ORDER — MIDAZOLAM HCL 2 MG/2ML IJ SOLN
0.5000 mg | Freq: Once | INTRAMUSCULAR | Status: AC | PRN
  Administered 2014-04-25: 0.5 mg via INTRAVENOUS

## 2014-04-25 MED ORDER — FENTANYL CITRATE 0.05 MG/ML IJ SOLN
INTRAMUSCULAR | Status: AC
  Filled 2014-04-25: qty 2

## 2014-04-25 MED ORDER — IOHEXOL 300 MG/ML  SOLN
INTRAMUSCULAR | Status: DC | PRN
  Administered 2014-04-25: 10 mL

## 2014-04-25 MED ORDER — OXYBUTYNIN CHLORIDE 5 MG PO TABS: 5.0000 mg | ORAL_TABLET | Freq: Three times a day (TID) | ORAL | Status: DC

## 2014-04-25 MED ORDER — CEFAZOLIN SODIUM-DEXTROSE 2-3 GM-% IV SOLR
INTRAVENOUS | Status: AC
  Filled 2014-04-25: qty 50

## 2014-04-25 MED ORDER — ONDANSETRON HCL 4 MG/2ML IJ SOLN
INTRAMUSCULAR | Status: DC | PRN
  Administered 2014-04-25: 4 mg via INTRAVENOUS

## 2014-04-25 MED ORDER — ONDANSETRON HCL 4 MG/2ML IJ SOLN
INTRAMUSCULAR | Status: AC
Start: 2014-04-25 — End: 2014-04-25
  Filled 2014-04-25: qty 2

## 2014-04-25 MED ORDER — CEPHALEXIN 500 MG PO CAPS: 500.0000 mg | ORAL_CAPSULE | Freq: Two times a day (BID) | ORAL | Status: DC

## 2014-04-25 MED ORDER — PROMETHAZINE HCL 25 MG/ML IJ SOLN: 6.2500 mg | INTRAMUSCULAR | Status: DC | PRN

## 2014-04-25 MED ORDER — ACETAMINOPHEN 325 MG PO TABS: 650.0000 mg | ORAL_TABLET | ORAL | Status: DC | PRN

## 2014-04-25 MED ORDER — SODIUM CHLORIDE 0.9 % IJ SOLN: 3.0000 mL | Freq: Two times a day (BID) | INTRAMUSCULAR | Status: DC

## 2014-04-25 MED ORDER — SODIUM CHLORIDE 0.9 % IV BOLUS (SEPSIS)
500.0000 mL | Freq: Once | INTRAVENOUS | Status: AC
  Administered 2014-04-25: 500 mL via INTRAVENOUS

## 2014-04-25 MED ORDER — OXYCODONE-ACETAMINOPHEN 10-325 MG PO TABS: 1.0000 | ORAL_TABLET | ORAL | Status: DC | PRN

## 2014-04-25 MED ORDER — LIDOCAINE HCL (CARDIAC) 20 MG/ML IV SOLN
INTRAVENOUS | Status: AC
  Filled 2014-04-25: qty 5

## 2014-04-25 MED ORDER — MEPERIDINE HCL 50 MG/ML IJ SOLN: 6.2500 mg | INTRAMUSCULAR | Status: DC | PRN

## 2014-04-25 MED ORDER — SODIUM CHLORIDE 0.9 % IJ SOLN: 3.0000 mL | INTRAMUSCULAR | Status: DC | PRN

## 2014-04-25 MED ORDER — OXYCODONE HCL 5 MG PO TABS: 5.0000 mg | ORAL_TABLET | ORAL | Status: DC | PRN

## 2014-04-25 MED ORDER — SODIUM CHLORIDE 0.9 % IR SOLN
Status: DC | PRN
  Administered 2014-04-25: 4000 mL

## 2014-04-25 MED ORDER — LIDOCAINE HCL (CARDIAC) 20 MG/ML IV SOLN
INTRAVENOUS | Status: DC | PRN
  Administered 2014-04-25: 100 mg via INTRAVENOUS

## 2014-04-25 MED ORDER — MIDAZOLAM HCL 5 MG/5ML IJ SOLN
INTRAMUSCULAR | Status: DC | PRN
  Administered 2014-04-25: 2 mg via INTRAVENOUS

## 2014-04-25 MED ORDER — LIDOCAINE HCL 2 % EX GEL
CUTANEOUS | Status: AC
  Filled 2014-04-25: qty 10

## 2014-04-25 MED ORDER — FENTANYL CITRATE 0.05 MG/ML IJ SOLN
100.0000 ug | Freq: Once | INTRAMUSCULAR | Status: AC
  Administered 2014-04-25: 100 ug via INTRAVENOUS
  Filled 2014-04-25: qty 2

## 2014-04-25 MED ORDER — LACTATED RINGERS IV SOLN: INTRAVENOUS | Status: DC

## 2014-04-25 MED ORDER — 0.9 % SODIUM CHLORIDE (POUR BTL) OPTIME
TOPICAL | Status: DC | PRN
  Administered 2014-04-25: 1000 mL

## 2014-04-25 MED ORDER — MIDAZOLAM HCL 2 MG/2ML IJ SOLN
INTRAMUSCULAR | Status: AC
  Filled 2014-04-25: qty 2

## 2014-04-25 MED ORDER — TAMSULOSIN HCL 0.4 MG PO CAPS
0.4000 mg | ORAL_CAPSULE | Freq: Every day | ORAL | Status: DC
  Administered 2014-04-25: 0.4 mg via ORAL
  Filled 2014-04-25: qty 1

## 2014-04-25 MED ORDER — SODIUM CHLORIDE 0.9 % IV SOLN: 250.0000 mL | INTRAVENOUS | Status: DC | PRN

## 2014-04-25 MED ORDER — CEFAZOLIN SODIUM-DEXTROSE 2-3 GM-% IV SOLR
2.0000 g | Freq: Once | INTRAVENOUS | Status: AC
  Administered 2014-04-25: 2 g via INTRAVENOUS

## 2014-04-25 MED ORDER — STERILE WATER FOR IRRIGATION IR SOLN
Status: DC | PRN
  Administered 2014-04-25: 3000 mL

## 2014-04-25 MED ORDER — FENTANYL CITRATE 0.05 MG/ML IJ SOLN
25.0000 ug | INTRAMUSCULAR | Status: DC | PRN
  Administered 2014-04-25 (×2): 50 ug via INTRAVENOUS

## 2014-04-25 MED ORDER — MORPHINE SULFATE 10 MG/ML IJ SOLN: 2.0000 mg | INTRAMUSCULAR | Status: DC | PRN

## 2014-04-25 MED ORDER — PROPOFOL 10 MG/ML IV BOLUS
INTRAVENOUS | Status: AC
  Filled 2014-04-25: qty 20

## 2014-04-25 MED ORDER — OXYBUTYNIN CHLORIDE 5 MG PO TABS
5.0000 mg | ORAL_TABLET | Freq: Once | ORAL | Status: AC | PRN
  Administered 2014-04-25: 5 mg via ORAL

## 2014-04-25 MED ORDER — OXYCODONE HCL 5 MG PO TABS
ORAL_TABLET | ORAL | Status: AC
  Filled 2014-04-25: qty 2

## 2014-04-25 MED ORDER — DEXAMETHASONE SODIUM PHOSPHATE 10 MG/ML IJ SOLN
INTRAMUSCULAR | Status: DC | PRN
  Administered 2014-04-25: 10 mg via INTRAVENOUS

## 2014-04-25 MED ORDER — MORPHINE SULFATE 2 MG/ML IJ SOLN
2.0000 mg | Freq: Once | INTRAMUSCULAR | Status: AC
  Administered 2014-04-25: 2 mg via INTRAVENOUS
  Filled 2014-04-25: qty 1

## 2014-04-25 MED ORDER — OXYCODONE HCL 5 MG PO TABS
5.0000 mg | ORAL_TABLET | ORAL | Status: DC | PRN
  Administered 2014-04-25: 10 mg via ORAL

## 2014-04-25 MED ORDER — OXYBUTYNIN CHLORIDE 5 MG PO TABS
ORAL_TABLET | ORAL | Status: AC
  Filled 2014-04-25: qty 1

## 2014-04-25 MED ORDER — ONDANSETRON HCL 4 MG/2ML IJ SOLN
4.0000 mg | Freq: Once | INTRAMUSCULAR | Status: AC
  Administered 2014-04-25: 4 mg via INTRAVENOUS
  Filled 2014-04-25: qty 2

## 2014-04-25 MED ORDER — DEXAMETHASONE SODIUM PHOSPHATE 10 MG/ML IJ SOLN
INTRAMUSCULAR | Status: AC
Start: 2014-04-25 — End: 2014-04-25
  Filled 2014-04-25: qty 1

## 2014-04-25 SURGICAL SUPPLY — 24 items
BAG URO CATCHER STRL LF (DRAPE) ×3 IMPLANT
BASKET ZERO TIP NITINOL 2.4FR (BASKET) ×3 IMPLANT
CATH INTERMIT  6FR 70CM (CATHETERS) ×3 IMPLANT
CLOTH BEACON ORANGE TIMEOUT ST (SAFETY) ×3 IMPLANT
DRAPE CAMERA CLOSED 9X96 (DRAPES) ×3 IMPLANT
FIBER LASER FLEXIVA 1000 (UROLOGICAL SUPPLIES) IMPLANT
FIBER LASER FLEXIVA 200 (UROLOGICAL SUPPLIES) IMPLANT
FIBER LASER FLEXIVA 365 (UROLOGICAL SUPPLIES) IMPLANT
FIBER LASER FLEXIVA 550 (UROLOGICAL SUPPLIES) IMPLANT
FIBER LASER TRAC TIP (UROLOGICAL SUPPLIES) IMPLANT
GLOVE BIOGEL M STRL SZ7.5 (GLOVE) ×3 IMPLANT
GOWN STRL REUS W/ TWL XL LVL3 (GOWN DISPOSABLE) ×1 IMPLANT
GOWN STRL REUS W/TWL LRG LVL3 (GOWN DISPOSABLE) ×6 IMPLANT
GOWN STRL REUS W/TWL XL LVL3 (GOWN DISPOSABLE) ×2
GUIDEWIRE ANG ZIPWIRE 038X150 (WIRE) IMPLANT
GUIDEWIRE STR DUAL SENSOR (WIRE) ×3 IMPLANT
IV NS 1000ML (IV SOLUTION) ×2
IV NS 1000ML BAXH (IV SOLUTION) ×1 IMPLANT
MANIFOLD NEPTUNE II (INSTRUMENTS) ×3 IMPLANT
PACK CYSTO (CUSTOM PROCEDURE TRAY) ×3 IMPLANT
SHEATH ACCESS URETERAL 38CM (SHEATH) ×3 IMPLANT
STENT CONTOUR 6FRX24X.038 (STENTS) ×3 IMPLANT
TUBING CONNECTING 10 (TUBING) ×2 IMPLANT
TUBING CONNECTING 10' (TUBING) ×1

## 2014-04-25 NOTE — Transfer of Care (Signed)
Immediate Anesthesia Transfer of Care Note  Patient: Seth Edwards  Procedure(s) Performed: Procedure(s): CYSTOSCOPY/URETEROSCOPY/STONE EXTRACTION/STENT PLACEMENT (Left)  Patient Location: PACU  Anesthesia Type:General  Level of Consciousness: awake, alert  and oriented  Airway & Oxygen Therapy: Patient Spontanous Breathing and Patient connected to face mask oxygen  Post-op Assessment: Report given to PACU RN and Post -op Vital signs reviewed and stable  Post vital signs: Reviewed and stable  Complications: No apparent anesthesia complications

## 2014-04-25 NOTE — ED Provider Notes (Signed)
CSN: [id number]     Arrival date & time 04/24/14  2202 History   First MD Initiated Contact with Patient 04/25/14 0058     Chief Complaint  Patient presents with  . Flank Pain     (Consider location/radiation/quality/duration/timing/severity/associated sxs/prior Treatment) Patient is a 43 y.o. male presenting with flank pain. The history is provided by the patient.  Flank Pain This is a recurrent problem. The current episode started more than 1 week ago. The problem occurs constantly. The problem has not changed since onset.Pertinent negatives include no chest pain, no headaches and no shortness of breath. Nothing aggravates the symptoms. Nothing relieves the symptoms. He has tried nothing for the symptoms. The treatment provided no relief.    Past Medical History  Diagnosis Date  . Arthritis   . GERD (gastroesophageal reflux disease)   . Kidney stones   . Anxiety     panic attacks  . Headache(784.0)     takes atenolol for headaches   Past Surgical History  Procedure Laterality Date  . Anterior cervical decomp/discectomy fusion  2012    c5 - c7   Family History  Problem Relation Age of Onset  . Heart disease Maternal Grandfather   . Diabetes Maternal Grandfather    History  Substance Use Topics  . Smoking status: Never Smoker   . Smokeless tobacco: Never Used  . Alcohol Use: No    Review of Systems  Constitutional: Negative for fever.  Respiratory: Negative for shortness of breath.   Cardiovascular: Negative for chest pain.  Genitourinary: Positive for flank pain.  Neurological: Negative for headaches.  All other systems reviewed and are negative.     Allergies  Doxycycline; Septra; Sulfa antibiotics; and Ivp dye  Home Medications   Prior to Admission medications   Medication Sig Start Date End Date Taking? Authorizing Provider  ALPRAZolam Prudy Feeler(XANAX) 0.5 MG tablet Take 0.5-1 mg by mouth 2 (two) times daily as needed for anxiety.   Yes Historical Provider, MD   oxyCODONE-acetaminophen (PERCOCET) 10-325 MG per tablet Take 1 tablet by mouth every 4 (four) hours as needed for pain. 04/18/14  Yes Vida RollerBrian D Miller, MD  promethazine (PHENERGAN) 12.5 MG tablet Take 1 tablet (12.5 mg total) by mouth every 6 (six) hours as needed for nausea or vomiting. 04/18/14  Yes Vida RollerBrian D Miller, MD  QUEtiapine (SEROQUEL) 100 MG tablet Take 100 mg by mouth at bedtime.   Yes Historical Provider, MD  tamsulosin (FLOMAX) 0.4 MG CAPS capsule Take 1 capsule (0.4 mg total) by mouth 2 (two) times daily. 04/18/14  Yes Vida RollerBrian D Miller, MD  topiramate (TOPAMAX) 50 MG tablet Take 100 mg by mouth 2 (two) times daily.   Yes Historical Provider, MD  venlafaxine XR (EFFEXOR-XR) 75 MG 24 hr capsule Take 75 mg by mouth daily with breakfast.   Yes Historical Provider, MD  zolpidem (AMBIEN) 10 MG tablet Take 10 mg by mouth at bedtime as needed for sleep.   Yes Historical Provider, MD   BP 139/86  Pulse 95  Temp(Src) 98 F (36.7 C) (Oral)  Resp 16  SpO2 97% Physical Exam  Constitutional: He is oriented to person, place, and time. He appears well-developed and well-nourished. No distress.  HENT:  Head: Normocephalic and atraumatic.  Mouth/Throat: Oropharynx is clear and moist.  Eyes: Conjunctivae are normal. Pupils are equal, round, and reactive to light.  Neck: Normal range of motion. Neck supple.  Cardiovascular: Normal rate, regular rhythm and intact distal pulses.   Pulmonary/Chest: Effort  normal and breath sounds normal. He has no wheezes. He has no rales.  Abdominal: Soft. Bowel sounds are normal. There is no tenderness. There is no rebound and no guarding.  Musculoskeletal: Normal range of motion.  Neurological: He is alert and oriented to person, place, and time.  Skin: Skin is warm and dry.  Psychiatric: He has a normal mood and affect.    ED Course  Procedures (including critical care time) Labs Review Labs Reviewed  URINALYSIS, ROUTINE W REFLEX MICROSCOPIC - Abnormal; Notable  for the following:    Color, Urine AMBER (*)    APPearance CLOUDY (*)    Hgb urine dipstick SMALL (*)    Ketones, ur 15 (*)    Protein, ur 30 (*)    All other components within normal limits  URINE MICROSCOPIC-ADD ON    Imaging Review Ct Abdomen Pelvis Wo Contrast  04/25/2014   CLINICAL DATA:  Left flank pain. Kidney stone. Follow-up evaluation.  EXAM: CT ABDOMEN AND PELVIS WITHOUT CONTRAST  TECHNIQUE: Multidetector CT imaging of the abdomen and pelvis was performed following the standard protocol without IV contrast.  COMPARISON:  CT 04/18/2014.  FINDINGS: Liver normal. Spleen normal. Pancreas normal. No biliary distention. Gallbladder is nondistended.  Adrenals normal. Right kidney unremarkable. 4 mm stone distal left ureter is again noted. The stone has distended slightly from prior study of 04/18/2014. There is associated mild left hydroureter and hydronephrosis. Nonobstructing 3 mm stone in the left renal collecting system noted. Bladder is nondistended. Prostate normal in size.  No significant adenopathy.  Aorta normal caliber.  Appendix normal. Diverticulosis. No evidence of bowel obstruction. No free air. No mesenteric mass. No significant abdominal wall hernia.  Lung bases are clear. Mild cardiomegaly. No acute bony abnormality. Stable sclerotic densities in the pelvis and hips consistent with bone islands.  IMPRESSION: 1. 4 mm stone again noted in the distal left ureter. The stone has descended slightly from prior study of 04/18/2014.  2.  Left nephrolithiasis.   Electronically Signed   By: Maisie Fushomas  Register   On: 04/25/2014 02:46     EKG Interpretation None      MDM   Final diagnoses:  None    513 case d/w Dr. Retta Dionesahlstedt will see in am    Giovany Cosby Smitty CordsK Shameika Speelman-Rasch, MD 04/25/14 [phone]

## 2014-04-25 NOTE — Op Note (Signed)
PATIENT:  Seth Edwards  PRE-OPERATIVE DIAGNOSIS: 4 mm left distal ureteral stone  POST-OPERATIVE DIAGNOSIS: Same  PROCEDURE: cystoscopy, left retrograde ureteropyelogram, left ureteroscopic stone extraction, interpretive fluoroscopy, placement of left double-J stent  SURGEON:  Bertram MillardStephen M. Tynika Luddy, M.D.  ANESTHESIA:  General  EBL:  Minimal  DRAINS: 24 cm x 6 French contour stent with string  LOCAL MEDICATIONS USED:  None  SPECIMEN:  Stone, to patient's wife  INDICATION: Seth Edwards is a 43 year old male who presented earlier today for the second time to the emergency room with a painful, non-progressing 4 mm left distal ureteral stone. He is admitted at this time for stone management with ureteroscopy. The procedure has been detailed with the patient and his wife who desire to proceed.  Description of procedure: The patient was properly identified and marked (if applicable) in the holding area. They were then  taken to the operating room and placed on the table in a supine position. General anesthesia was then administered. Once fully anesthetized the patient was moved to the dorsolithotomy position and the genitalia and perineum were sterilely prepped and draped in standard fashion. An official timeout was then performed.  A 22 French panendoscope was placed under direct vision and the patient's urethra. The 12 lens was used. The bladder was inspected circumferentially. Ureteral orifices were normal in configuration and location. There were no bladder calculi. There were no bladder lesions. The 6 [place]French open-ended catheter was used to perform a retrograde ureteropyelogram. Ureteropyelogram was performed with Omnipaque. This revealed a filling defect approximately 2 cm up the ureter. There was proximal widening of the ureter without further filling defects. The pyelocalyceal system was dilated, without filling defects. A guidewire was advanced through the open-ended catheter at  this point, into the renal pelvis using fluoroscopic guidance.  At this point, the patient had an erection. It was difficult to perform ureteral dilation, but this was eventually performed using first a short and then the longer ureteral access sheath-12/14. The rigid ureteroscope was used to navigate the urethra and then into the left distal ureter. I inspected proximally. No stone was seen. At this point, I felt that the stone had moved proximally due to the pressure from irrigation. I then passed the longer ureteral access sheath, and using the digital flexible ureteroscope, I navigated the sheath up to the proximal ureter. No stone was seen. I then found 2 calculi in the interpolar calyceal system. These were snared with the 0 tip Nitinol basket and extracted easily through the sheath. Careful inspection of the entire pyelocalyceal system revealed normal urothelium, without further calculi. I inspected the renal pelvis and then the proximal ureter. No stones were seen. At this point, the scope was removed, and a guidewire passed through the sheath into the left renal pelvis using fluoroscopic guidance. The sheath was removed. I then, using the cystoscope, placed a 24 cm x 6 French contour stent, leaving the string on. The bladder was drained and the scope removed. The string was then taped to the patient's penis.  The patient tolerated the procedure well.    PLAN OF CARE: Discharge to home after PACU  PATIENT DISPOSITION:  PACU - hemodynamically stable.

## 2014-04-25 NOTE — Discharge Instructions (Signed)
[facility] [phone] Post Ureteroscopy With or Without Stent Instructions  Definitions:  Ureter: The duct that transports urine from the kidney to the bladder. Stent:   A plastic hollow tube that is placed into the ureter, from the kidney to the  bladder to prevent the ureter from swelling shut.  GENERAL INSTRUCTIONS:  Despite the fact that no skin incisions were used, the area around the ureter and bladder is raw and irritated. The stent is a foreign body which will further irritate the bladder wall. This irritation is manifested by increased frequency of urination, both day and night, and by an increase in the urge to urinate. In some, the urge to urinate is present almost always. Sometimes the urge is strong enough that you may not be able to stop yourself from urinating. The only real cure is to remove the stent and then give time for the bladder wall to heal which can't be done until the danger of the ureter swelling shut has passed, which varies.  It is OK to pull string to remove stent on Monday morning.  You may see some blood in your urine while the stent is in place and a few days afterwards. Do not be alarmed, even if the urine was clear for a while. Get off your feet and drink lots of fluids until clearing occurs. If you start to pass clots or don't improve, call us.  DIET: You may return to your normal diet immediately. Because of the raw surface of your bladder, alcohol, spicy foods, acid type foods and drinks with caffeine may cause irritation or frequency and should be used in moderation. To keep your urine flowing freely and to avoid constipation, drink plenty of fluids during the day ( 8-10 glasses ). Tip: Avoid cranberry juice because it is very acidic.  ACTIVITY: Your physical activity doesn't need to be restricted. However, if you are very active, you may see some blood in your urine. We suggest that you reduce your activity under these circumstances  until the bleeding has stopped.  BOWELS: It is important to keep your bowels regular during the postoperative period. Straining with bowel movements can cause bleeding. A bowel movement every other day is reasonable. Use a mild laxative if needed, such as Milk of Magnesia 2-3 tablespoons, or 2 Dulcolax tablets. Call if you continue to have problems. If you have been taking narcotics for pain, before, during or after your surgery, you may be constipated. Take a laxative if necessary.   MEDICATION: You should resume your pre-surgery medications unless told not to. In addition you will often be given an antibiotic to prevent infection. These should be taken as prescribed until the bottles are finished unless you are having an unusual reaction to one of the drugs.  PROBLEMS YOU SHOULD REPORT TO US:  Fevers over 100.5 Fahrenheit.  Heavy bleeding, or clots ( See above notes about blood in urine ).  Inability to urinate.  Drug reactions ( hives, rash, nausea, vomiting, diarrhea ).  Severe burning or pain with urination that is not improving.  FOLLOW-UP: You will need a follow-up appointment to monitor your progress. Call for this appointment at the number listed above. Usually the first appointment will be about three to fourteen days after your surgery.

## 2014-04-25 NOTE — ED Notes (Signed)
Pt transferred to surgery via surgery team.

## 2014-04-25 NOTE — Anesthesia Preprocedure Evaluation (Signed)
Anesthesia Evaluation  Patient identified by MRN, date of birth, ID band Patient awake    Reviewed: Allergy & Precautions, H&P , Patient's Chart, lab work & pertinent test results, reviewed documented beta blocker date and time   History of Anesthesia Complications Negative for: history of anesthetic complications  Airway Mallampati: II TM Distance: >3 FB Neck ROM: full    Dental   Pulmonary  breath sounds clear to auscultation        Cardiovascular Exercise Tolerance: Good Rhythm:regular Rate:Normal     Neuro/Psych  Headaches,  Neuromuscular disease negative psych ROS   GI/Hepatic GERD-  Controlled,  Endo/Other    Renal/GU Renal disease     Musculoskeletal   Abdominal   Peds  Hematology   Anesthesia Other Findings   Reproductive/Obstetrics                           Anesthesia Physical Anesthesia Plan  ASA: II  Anesthesia Plan: General LMA   Post-op Pain Management:    Induction:   Airway Management Planned:   Additional Equipment:   Intra-op Plan:   Post-operative Plan:   Informed Consent: I have reviewed the patients History and Physical, chart, labs and discussed the procedure including the risks, benefits and alternatives for the proposed anesthesia with the patient or authorized representative who has indicated his/her understanding and acceptance.   Dental Advisory Given  Plan Discussed with: CRNA, Surgeon and Anesthesiologist  Anesthesia Plan Comments:         Anesthesia Quick Evaluation

## 2014-04-25 NOTE — Consult Note (Signed)
Urology Consult   Physician requesting consult: Dr. Terressa KoyanagiPalumbo-Rasch  Reason for consult: Kidney stone  History of Present Illness: Seth KluverRichard A Edwards is a 43 y.o. male who presents for the second time in a week with left flank pain secondary to a 4 mm left distal ureteral stone with hydronephrosis. He has a prior history of urolithiasis, and underwent lithotripsy in 2013 for kidney stone. He began having left flank pain about a week ago. He presented the emergency room where CT was performed revealing a left distal ureteral stone. He has had inadequate pain relief with Percocet at home. He re\re presented to the emergency room last night with persistent pain, repeat CT scan showed minimal progression of a left ureteral stone with persistent left hydronephrosis. As the patient has had breakthrough pain and repeated visits to the emergency room, it was recommended that he undergo stone management today. He is admitted for that procedure.    Past Medical History  Diagnosis Date  . Arthritis   . GERD (gastroesophageal reflux disease)   . Kidney stones   . Anxiety     panic attacks  . Headache(784.0)     takes atenolol for headaches    Past Surgical History  Procedure Laterality Date  . Anterior cervical decomp/discectomy fusion  2012    c5 - c7     Current Hospital Medications: Scheduled Meds: . tamsulosin  0.4 mg Oral Daily   Continuous Infusions:  PRN Meds:.  Allergies:  Allergies  Allergen Reactions  . Doxycycline Itching    Tachycardia  . Septra [Bactrim] Itching    Tachycardia  . Sulfa Antibiotics Other (See Comments)    Makes him feel weird  . Ivp Dye [Iodinated Diagnostic Agents] Rash    Code: RASH, Desc: pt developed rash around neck just after injection. this was first time pt ever had contrast. benadryl given per radiologist., Onset Date: [id number]    Family History  Problem Relation Age of Onset  . Heart disease Maternal Grandfather   . Diabetes Maternal  Grandfather     Social History:  reports that he has never smoked. He has never used smokeless tobacco. He reports that he does not drink alcohol or use illicit drugs.  ROS: A complete review of systems was performed.  All systems are negative except for pertinent findings as noted. Flank pain, mild nausea, no vomiting. No fever, chills.  Physical Exam:  Vital signs in last 24 hours: Temp:  [98 F (36.7 C)-98.1 F (36.7 C)] 98 F (36.7 C) (10/10 0420) Pulse Rate:  [95-100] 95 (10/10 0420) Resp:  [16] 16 (10/10 0420) BP: (139-141)/(84-90) 141/84 mmHg (10/10 0420) SpO2:  [94 %-100 %] 94 % (10/10 0420) General:  Alert and oriented, No acute distress HEENT: Normocephalic, atraumatic Neck: No JVD or lymphadenopathy Cardiovascular: Regular rate and rhythm Lungs: Clear bilaterally Abdomen: Soft, minimal left lower quadrant tenderness. Back: Minimal left CVA tenderness Extremities: No edema Neurologic: Grossly intact  Laboratory Data:  No results found for this basename: WBC, HGB, HCT, PLT,  in the last 72 hours  No results found for this basename: NA, K, CL, CO3, GLUCOSE, BUN, CALCIUM, CREATININE,  in the last 72 hours   Results for orders placed during the hospital encounter of 04/24/14 (from the past 24 hour(s))  URINALYSIS, ROUTINE W REFLEX MICROSCOPIC     Status: Abnormal   Collection Time    04/24/14 10:53 PM      Result Value Ref Range   Color, Urine AMBER (*) YELLOW  APPearance CLOUDY (*) CLEAR   Specific Gravity, Urine 1.025  1.005 - 1.030   pH 7.5  5.0 - 8.0   Glucose, UA NEGATIVE  NEGATIVE mg/dL   Hgb urine dipstick SMALL (*) NEGATIVE   Bilirubin Urine NEGATIVE  NEGATIVE   Ketones, ur 15 (*) NEGATIVE mg/dL   Protein, ur 30 (*) NEGATIVE mg/dL   Urobilinogen, UA 1.0  0.0 - 1.0 mg/dL   Nitrite NEGATIVE  NEGATIVE   Leukocytes, UA NEGATIVE  NEGATIVE  URINE MICROSCOPIC-ADD ON     Status: None   Collection Time    04/24/14 10:53 PM      Result Value Ref Range    Squamous Epithelial / LPF RARE  RARE   WBC, UA 0-2  <3 WBC/hpf   RBC / HPF 7-10  <3 RBC/hpf   Bacteria, UA RARE  RARE   Recent Results (from the past 240 hour(s))  URINE CULTURE     Status: None   Collection Time    04/17/14  1:05 PM      Result Value Ref Range Status   Specimen Description URINE, CLEAN CATCH   Final   Special Requests NONE   Final   Culture  Setup Time     Final   Value: 04/17/2014 17:20     Performed at [facility]   Colony Count     Final   Value: 10,000 COLONIES/ML     Performed at [facility]   Culture     Final   Value: Multiple bacterial morphotypes present, none predominant. Suggest appropriate recollection if clinically indicated.     Performed at [facility]   Report Status 04/18/2014 FINAL   Final    Renal Function:  Recent Labs  04/18/14 1816  CREATININE 1.64*   The CrCl is unknown because both a height and weight (above a minimum accepted value) are required for this calculation.  Radiologic Imaging: Ct Abdomen Pelvis Wo Contrast  04/25/2014   CLINICAL DATA:  Left flank pain. Kidney stone. Follow-up evaluation.  EXAM: CT ABDOMEN AND PELVIS WITHOUT CONTRAST  TECHNIQUE: Multidetector CT imaging of the abdomen and pelvis was performed following the standard protocol without IV contrast.  COMPARISON:  CT 04/18/2014.  FINDINGS: Liver normal. Spleen normal. Pancreas normal. No biliary distention. Gallbladder is nondistended.  Adrenals normal. Right kidney unremarkable. 4 mm stone distal left ureter is again noted. The stone has distended slightly from prior study of 04/18/2014. There is associated mild left hydroureter and hydronephrosis. Nonobstructing 3 mm stone in the left renal collecting system noted. Bladder is nondistended. Prostate normal in size.  No significant adenopathy.  Aorta normal caliber.  Appendix normal. Diverticulosis. No evidence of bowel obstruction. No free air. No mesenteric mass. No significant abdominal  wall hernia.  Lung bases are clear. Mild cardiomegaly. No acute bony abnormality. Stable sclerotic densities in the pelvis and hips consistent with bone islands.  IMPRESSION: 1. 4 mm stone again noted in the distal left ureter. The stone has descended slightly from prior study of 04/18/2014.  2.  Left nephrolithiasis.   Electronically Signed   By: Maisie Fushomas  Register   On: 04/25/2014 02:46    I independently reviewed the above imaging studies.  Impression/Assessment:  Persistent left distal ureteral stone with inadequate pain management both at home and here in the emergency room  Plan:  I've spoken with the patient and his wife, Trinda Pascalnastasia, regarding admission for ureteroscopic stone extraction. The procedure was discussed the risks and complications  discussed as well as the possible need for a stent. They understand and desire to proceed. We will proceed with that ureteroscopy this morning.

## 2014-04-25 NOTE — Anesthesia Postprocedure Evaluation (Signed)
Anesthesia Post Note  Patient: Seth Edwards  Procedure(s) Performed: Procedure(s) (LRB): CYSTOSCOPY/URETEROSCOPY/STONE EXTRACTION/STENT PLACEMENT (Left)  Anesthesia type: GA  Patient location: PACU  Post pain: Pain level controlled  Post assessment: Post-op Vital signs reviewed  Last Vitals:  Filed Vitals:   04/25/14 0637  BP: 141/95  Pulse: 96  Temp: 36.7 C  Resp: 18    Post vital signs: Reviewed  Level of consciousness: sedated  Complications: No apparent anesthesia complications

## 2014-04-27 ENCOUNTER — Emergency Department (HOSPITAL_COMMUNITY)
Admission: EM | Admit: 2014-04-27 | Discharge: 2014-04-27 | Disposition: A | Discharge status: Home / Self Care | Payer: Medicare Other | Attending: Emergency Medicine | Admitting: Emergency Medicine

## 2014-04-27 ENCOUNTER — Encounter (HOSPITAL_COMMUNITY): Payer: Self-pay | Admitting: Emergency Medicine

## 2014-04-27 DIAGNOSIS — N23 Unspecified renal colic: Secondary | ICD-10-CM | POA: Insufficient documentation

## 2014-04-27 DIAGNOSIS — Z79899 Other long term (current) drug therapy: Secondary | ICD-10-CM | POA: Insufficient documentation

## 2014-04-27 DIAGNOSIS — R109 Unspecified abdominal pain: Secondary | ICD-10-CM | POA: Diagnosis present

## 2014-04-27 DIAGNOSIS — Z8719 Personal history of other diseases of the digestive system: Secondary | ICD-10-CM | POA: Diagnosis not present

## 2014-04-27 DIAGNOSIS — M1990 Unspecified osteoarthritis, unspecified site: Secondary | ICD-10-CM | POA: Diagnosis not present

## 2014-04-27 DIAGNOSIS — F419 Anxiety disorder, unspecified: Secondary | ICD-10-CM | POA: Diagnosis not present

## 2014-04-27 LAB — URINALYSIS, ROUTINE W REFLEX MICROSCOPIC
Bilirubin Urine: NEGATIVE
GLUCOSE, UA: NEGATIVE mg/dL
KETONES UR: NEGATIVE mg/dL
Leukocytes, UA: NEGATIVE
Nitrite: NEGATIVE
Protein, ur: NEGATIVE mg/dL
Specific Gravity, Urine: 1.012 (ref 1.005–1.030)
Urobilinogen, UA: 0.2 mg/dL (ref 0.0–1.0)
pH: 6.5 (ref 5.0–8.0)

## 2014-04-27 LAB — COMPREHENSIVE METABOLIC PANEL
ALT: 26 U/L (ref 0–53)
AST: 30 U/L (ref 0–37)
Albumin: 4.2 g/dL (ref 3.5–5.2)
Alkaline Phosphatase: 77 U/L (ref 39–117)
Anion gap: 14 (ref 5–15)
BILIRUBIN TOTAL: 0.5 mg/dL (ref 0.3–1.2)
BUN: 15 mg/dL (ref 6–23)
CALCIUM: 9.6 mg/dL (ref 8.4–10.5)
CHLORIDE: 105 meq/L (ref 96–112)
CO2: 24 meq/L (ref 19–32)
Creatinine, Ser: 0.97 mg/dL (ref 0.50–1.35)
GFR calc non Af Amer: >90 mL/min (ref >90)
GLUCOSE: 108 mg/dL — AB (ref 70–99)
Potassium: 3.3 mEq/L — ABNORMAL LOW (ref 3.7–5.3)
Sodium: 143 mEq/L (ref 137–147)
Total Protein: 7 g/dL (ref 6.0–8.3)

## 2014-04-27 LAB — I-STAT CHEM 8, ED
BUN: 14 mg/dL (ref 6–23)
CALCIUM ION: 1.19 mmol/L (ref 1.12–1.23)
CHLORIDE: 107 meq/L (ref 96–112)
Creatinine, Ser: 1 mg/dL (ref 0.50–1.35)
Glucose, Bld: 109 mg/dL — ABNORMAL HIGH (ref 70–99)
HCT: 40 % (ref 39.0–52.0)
Hemoglobin: 13.6 g/dL (ref 13.0–17.0)
Potassium: 3.2 mEq/L — ABNORMAL LOW (ref 3.7–5.3)
Sodium: 144 mEq/L (ref 137–147)
TCO2: 23 mmol/L (ref 0–100)

## 2014-04-27 LAB — CBC WITH DIFFERENTIAL/PLATELET
Basophils Absolute: 0 10*3/uL (ref 0.0–0.1)
Basophils Relative: 0 % (ref 0–1)
EOS PCT: 0 % (ref 0–5)
Eosinophils Absolute: 0 10*3/uL (ref 0.0–0.7)
HEMATOCRIT: 38.6 % — AB (ref 39.0–52.0)
HEMOGLOBIN: 13.2 g/dL (ref 13.0–17.0)
Lymphocytes Relative: 10 % — ABNORMAL LOW (ref 12–46)
Lymphs Abs: 1 10*3/uL (ref 0.7–4.0)
MCH: 30.3 pg (ref 26.0–34.0)
MCHC: 34.2 g/dL (ref 30.0–36.0)
MCV: 88.5 fL (ref 78.0–100.0)
MONO ABS: 0.8 10*3/uL (ref 0.1–1.0)
MONOS PCT: 8 % (ref 3–12)
Neutro Abs: 8.4 10*3/uL — ABNORMAL HIGH (ref 1.7–7.7)
Neutrophils Relative %: 82 % — ABNORMAL HIGH (ref 43–77)
Platelets: 285 10*3/uL (ref 150–400)
RBC: 4.36 MIL/uL (ref 4.22–5.81)
RDW: 11.7 % (ref 11.5–15.5)
WBC: 10.2 10*3/uL (ref 4.0–10.5)

## 2014-04-27 LAB — URINE MICROSCOPIC-ADD ON

## 2014-04-27 LAB — LIPASE, BLOOD: LIPASE: 19 U/L (ref 11–59)

## 2014-04-27 MED ORDER — OXYCODONE HCL 5 MG PO TABS: 5.0000 mg | ORAL_TABLET | ORAL | Status: AC | PRN

## 2014-04-27 MED ORDER — SODIUM CHLORIDE 0.9 % IV SOLN
1000.0000 mL | Freq: Once | INTRAVENOUS | Status: AC
  Administered 2014-04-27: 1000 mL via INTRAVENOUS

## 2014-04-27 MED ORDER — KETOROLAC TROMETHAMINE 30 MG/ML IJ SOLN: 30.0000 mg | Freq: Once | INTRAMUSCULAR | Status: DC

## 2014-04-27 MED ORDER — HYDROMORPHONE HCL 1 MG/ML IJ SOLN
1.0000 mg | Freq: Once | INTRAMUSCULAR | Status: AC
  Administered 2014-04-27: 1 mg via INTRAVENOUS
  Filled 2014-04-27: qty 1

## 2014-04-27 MED ORDER — KETOROLAC TROMETHAMINE 30 MG/ML IJ SOLN
30.0000 mg | Freq: Once | INTRAMUSCULAR | Status: AC
  Administered 2014-04-27: 30 mg via INTRAVENOUS
  Filled 2014-04-27: qty 1

## 2014-04-27 MED ORDER — HYDROMORPHONE HCL 1 MG/ML IJ SOLN
1.0000 mg | INTRAMUSCULAR | Status: DC | PRN
  Administered 2014-04-27 (×2): 1 mg via INTRAVENOUS
  Filled 2014-04-27 (×2): qty 1

## 2014-04-27 MED ORDER — ONDANSETRON HCL 4 MG/2ML IJ SOLN
4.0000 mg | Freq: Once | INTRAMUSCULAR | Status: AC
  Administered 2014-04-27: 4 mg via INTRAVENOUS
  Filled 2014-04-27: qty 2

## 2014-04-27 MED ORDER — SODIUM CHLORIDE 0.9 % IV SOLN: 1000.0000 mL | INTRAVENOUS | Status: DC

## 2014-04-27 MED ORDER — ONDANSETRON HCL 4 MG/2ML IJ SOLN: 4.0000 mg | Freq: Once | INTRAMUSCULAR | Status: DC

## 2014-04-27 NOTE — ED Notes (Signed)
Pt is highly agitated, rolling around on the bed, pulling off wires, and screaming. Wife and RN are at bedside trying to console pt. Pt remains highly agitated. MD notified.

## 2014-04-27 NOTE — ED Notes (Signed)
PT woke up at 4am with sudden onset left flank pain; pt states that he had emergency surgery Sat morning to remove a kidney stone; pt states that he was fine until this morning; pt c/o hot and cold chills and is restless

## 2014-04-27 NOTE — ED Notes (Signed)
Pt reports severe flank and lower abdomen pain, s/p stent placement on Saturday 10/10. Pt reports he took the stent out last night due to pain.

## 2014-04-27 NOTE — Discharge Instructions (Signed)

## 2014-04-27 NOTE — ED Provider Notes (Signed)
CSN: [id number]     Arrival date & time 04/27/14  0615 History   First MD Initiated Contact with Patient 04/27/14 [phone]     Chief Complaint  Patient presents with  . Flank Pain    HPI Patient presents to the emergency room with severe persistent left flank pain.  The patient has been having trouble with persistent left-sided ureteral pain.  Patient was diagnosed with a 4 mm ureteral stone. He was seen several times last week in the emergency department for this persistent pain. On Saturday morning he had a urological procedure to remove the ureteral stone. Following the procedure the patient had a stent placed.  The patient was having persistent discomfort so he decided to remove the stent on Saturday evening. He was doing okay yesterday but then this morning and suddenly woke up with severe pain in the left flank this unrelenting. He said several episodes of nausea and vomiting and dry heaves. He denies fevers. He denies diarrhea. Past Medical History  Diagnosis Date  . Arthritis   . GERD (gastroesophageal reflux disease)   . Kidney stones   . Anxiety     panic attacks  . Headache(784.0)     takes atenolol for headaches   Past Surgical History  Procedure Laterality Date  . Anterior cervical decomp/discectomy fusion  2012    c5 - c7   Family History  Problem Relation Age of Onset  . Heart disease Maternal Grandfather   . Diabetes Maternal Grandfather    History  Substance Use Topics  . Smoking status: Never Smoker   . Smokeless tobacco: Never Used  . Alcohol Use: No    Review of Systems  All other systems reviewed and are negative.     Allergies  Doxycycline; Septra; Sulfa antibiotics; and Ivp dye  Home Medications   Prior to Admission medications   Medication Sig Start Date End Date Taking? Authorizing Provider  ALPRAZolam Prudy Feeler(XANAX) 0.5 MG tablet Take 0.5-1 mg by mouth 2 (two) times daily as needed for anxiety.   Yes Historical Provider, MD  cephALEXin (KEFLEX) 500 MG  capsule Take 1 capsule (500 mg total) by mouth 2 (two) times daily. 04/25/14  Yes Chelsea AusStephen M Dahlstedt, MD  oxybutynin (DITROPAN) 5 MG tablet Take 1 tablet (5 mg total) by mouth 3 (three) times daily. 04/25/14  Yes Chelsea AusStephen M Dahlstedt, MD  oxyCODONE-acetaminophen (PERCOCET) 10-325 MG per tablet Take 1 tablet by mouth every 4 (four) hours as needed for pain. 04/25/14  Yes Chelsea AusStephen M Dahlstedt, MD  QUEtiapine (SEROQUEL) 100 MG tablet Take 100 mg by mouth at bedtime.   Yes Historical Provider, MD  topiramate (TOPAMAX) 50 MG tablet Take 100 mg by mouth 2 (two) times daily.   Yes Historical Provider, MD  venlafaxine XR (EFFEXOR-XR) 75 MG 24 hr capsule Take 75 mg by mouth daily with breakfast.   Yes Historical Provider, MD  zolpidem (AMBIEN) 10 MG tablet Take 10 mg by mouth at bedtime as needed for sleep.   Yes Historical Provider, MD  oxyCODONE (OXY IR/ROXICODONE) 5 MG immediate release tablet Take 1-2 tablets (5-10 mg total) by mouth every 4 (four) hours as needed for moderate pain. 04/27/14   Linwood DibblesJon Lenix Benoist, MD  promethazine (PHENERGAN) 12.5 MG tablet Take 1 tablet (12.5 mg total) by mouth every 6 (six) hours as needed for nausea or vomiting. 04/18/14   Vida RollerBrian D Miller, MD   BP 128/74  Pulse 77  Temp(Src) 97.3 F (36.3 C) (Oral)  Resp 16  SpO2  96% Physical Exam  Nursing note and vitals reviewed. Constitutional: He appears distressed.  HENT:  Head: Normocephalic and atraumatic.  Right Ear: External ear normal.  Left Ear: External ear normal.  Eyes: Conjunctivae are normal. Right eye exhibits no discharge. Left eye exhibits no discharge. No scleral icterus.  Neck: Neck supple. No tracheal deviation present.  Cardiovascular: Normal rate, regular rhythm and intact distal pulses.   Pulmonary/Chest: Effort normal and breath sounds normal. No stridor. No respiratory distress. He has no wheezes. He has no rales.  Abdominal: Soft. Bowel sounds are normal. He exhibits no distension and no mass. There is no  tenderness. There is CVA tenderness (left). There is no rebound and no guarding. No hernia.  Musculoskeletal: He exhibits no edema and no tenderness.  Neurological: He is alert. He has normal strength. No cranial nerve deficit (no facial droop, extraocular movements intact, no slurred speech) or sensory deficit. He exhibits normal muscle tone. He displays no seizure activity. Coordination normal.  Skin: Skin is warm and dry. No rash noted.  Psychiatric: He has a normal mood and affect.    ED Course  Procedures (including critical care time) Labs Review Labs Reviewed  URINALYSIS, ROUTINE W REFLEX MICROSCOPIC - Abnormal; Notable for the following:    APPearance CLOUDY (*)    Hgb urine dipstick LARGE (*)    All other components within normal limits  CBC WITH DIFFERENTIAL - Abnormal; Notable for the following:    HCT 38.6 (*)    Neutrophils Relative % 82 (*)    Neutro Abs 8.4 (*)    Lymphocytes Relative 10 (*)    All other components within normal limits  COMPREHENSIVE METABOLIC PANEL - Abnormal; Notable for the following:    Potassium 3.3 (*)    Glucose, Bld 108 (*)    All other components within normal limits  I-STAT CHEM 8, ED - Abnormal; Notable for the following:    Potassium 3.2 (*)    Glucose, Bld 109 (*)    All other components within normal limits  LIPASE, BLOOD  URINE MICROSCOPIC-ADD ON    Medications  0.9 %  sodium chloride infusion (0 mLs Intravenous Stopped 04/27/14 0947)    Followed by  0.9 %  sodium chloride infusion (not administered)  HYDROmorphone (DILAUDID) injection 1 mg (1 mg Intravenous Given 04/27/14 0947)  HYDROmorphone (DILAUDID) injection 1 mg (1 mg Intravenous Given 04/27/14 0736)  ondansetron (ZOFRAN) injection 4 mg (4 mg Intravenous Given 04/27/14 0736)  ketorolac (TORADOL) 30 MG/ML injection 30 mg (30 mg Intravenous Given 04/27/14 0947)   0930  Feeling better after pain management  MDM   Final diagnoses:  Renal colic    Discussed with Dr  Gearldine ShownEskeridge.  Unlikely to have urine leak.  Labs reassuring.  The patient should not have removed his stent so early but there is no emergent need to replace it at this point. Plan is to Treat pain.  Ok to follow up in the office.  The findings and plan were discussed with the patient and his wife.    Linwood DibblesJon Kymberli Wiegand, MD 04/27/14 [phone]

## 2014-10-06 ENCOUNTER — Encounter (HOSPITAL_COMMUNITY): Payer: Self-pay | Admitting: Emergency Medicine

## 2014-10-06 ENCOUNTER — Emergency Department (HOSPITAL_COMMUNITY)
Admission: EM | Admit: 2014-10-06 | Discharge: 2014-10-06 | Disposition: A | Discharge status: Home / Self Care | Payer: Medicare Other | Attending: Emergency Medicine | Admitting: Emergency Medicine

## 2014-10-06 DIAGNOSIS — R319 Hematuria, unspecified: Secondary | ICD-10-CM | POA: Diagnosis present

## 2014-10-06 DIAGNOSIS — M1990 Unspecified osteoarthritis, unspecified site: Secondary | ICD-10-CM | POA: Diagnosis not present

## 2014-10-06 DIAGNOSIS — Z79899 Other long term (current) drug therapy: Secondary | ICD-10-CM | POA: Insufficient documentation

## 2014-10-06 DIAGNOSIS — F419 Anxiety disorder, unspecified: Secondary | ICD-10-CM | POA: Insufficient documentation

## 2014-10-06 DIAGNOSIS — Z87442 Personal history of urinary calculi: Secondary | ICD-10-CM | POA: Insufficient documentation

## 2014-10-06 DIAGNOSIS — Z8719 Personal history of other diseases of the digestive system: Secondary | ICD-10-CM | POA: Insufficient documentation

## 2014-10-06 LAB — URINALYSIS, ROUTINE W REFLEX MICROSCOPIC
Bilirubin Urine: NEGATIVE
Glucose, UA: NEGATIVE mg/dL
Hgb urine dipstick: NEGATIVE
KETONES UR: NEGATIVE mg/dL
Leukocytes, UA: NEGATIVE
NITRITE: NEGATIVE
PH: 6.5 (ref 5.0–8.0)
Protein, ur: NEGATIVE mg/dL
Specific Gravity, Urine: 1.003 — ABNORMAL LOW (ref 1.005–1.030)
Urobilinogen, UA: 0.2 mg/dL (ref 0.0–1.0)

## 2014-10-06 MED ORDER — KETOROLAC TROMETHAMINE 60 MG/2ML IM SOLN
60.0000 mg | Freq: Once | INTRAMUSCULAR | Status: AC
  Administered 2014-10-06: 60 mg via INTRAMUSCULAR
  Filled 2014-10-06: qty 2

## 2014-10-06 MED ORDER — NAPROXEN 500 MG PO TABS: 500.0000 mg | ORAL_TABLET | Freq: Two times a day (BID) | ORAL | Status: DC

## 2014-10-06 MED ORDER — PROMETHAZINE HCL 12.5 MG PO TABS: 12.5000 mg | ORAL_TABLET | Freq: Four times a day (QID) | ORAL | Status: DC | PRN

## 2014-10-06 NOTE — ED Provider Notes (Signed)
CSN: [id number]     Arrival date & time 10/06/14  1956 History   First MD Initiated Contact with Patient 10/06/14 2116     Chief Complaint  Patient presents with  . Hematuria     (Consider location/radiation/quality/duration/timing/severity/associated sxs/prior Treatment) HPI Comments: The pt is a 44 y/o male with hx of kidney stone - last seen in October of 2015 when he had CT scan confirming 4mm KS - he presents today with recurrent pain that started this morning - acute in onset, sharp - intermittent, currently 8/10 and associate with intermittent hematuria today - he has been drinking plenty of water but has had no pain meds pta.   Patient is a 44 y.o. male presenting with hematuria. The history is provided by the patient and medical records.  Hematuria    Past Medical History  Diagnosis Date  . Arthritis   . GERD (gastroesophageal reflux disease)   . Kidney stones   . Anxiety     panic attacks  . Headache(784.0)     takes atenolol for headaches   Past Surgical History  Procedure Laterality Date  . Anterior cervical decomp/discectomy fusion  2012    c5 - c7  . Cystoscopy/retrograde/ureteroscopy/stone extraction with basket Left 04/25/2014    Procedure: CYSTOSCOPY/URETEROSCOPY/STONE EXTRACTION/STENT PLACEMENT;  Surgeon: Chelsea AusStephen M Dahlstedt, MD;  Location: [facility];  Service: Urology;  Laterality: Left;   Family History  Problem Relation Age of Onset  . Heart disease Maternal Grandfather   . Diabetes Maternal Grandfather    History  Substance Use Topics  . Smoking status: Never Smoker   . Smokeless tobacco: Never Used  . Alcohol Use: No    Review of Systems  Genitourinary: Positive for hematuria.  All other systems reviewed and are negative.     Allergies  Doxycycline; Septra; Sulfa antibiotics; and Ivp dye  Home Medications   Prior to Admission medications   Medication Sig Start Date End Date Taking? Authorizing Provider  ALPRAZolam Prudy Feeler(XANAX) 0.5 MG tablet  Take 0.5-1 mg by mouth 2 (two) times daily as needed for anxiety.   Yes Historical Provider, MD  diphenhydrAMINE (BENADRYL) 25 MG tablet Take 25 mg by mouth every 6 (six) hours as needed for allergies.   Yes Historical Provider, MD  sodium chloride (OCEAN) 0.65 % SOLN nasal spray Place 1-2 sprays into both nostrils as needed for congestion.   Yes Historical Provider, MD  venlafaxine XR (EFFEXOR-XR) 75 MG 24 hr capsule Take 75 mg by mouth daily with breakfast.   Yes Historical Provider, MD  zolpidem (AMBIEN) 10 MG tablet Take 10 mg by mouth at bedtime as needed for sleep.   Yes Historical Provider, MD  cephALEXin (KEFLEX) 500 MG capsule Take 1 capsule (500 mg total) by mouth 2 (two) times daily. Patient not taking: Reported on 10/06/2014 04/25/14   Marcine MatarStephen Dahlstedt, MD  naproxen (NAPROSYN) 500 MG tablet Take 1 tablet (500 mg total) by mouth 2 (two) times daily with a meal. 10/06/14   Eber HongBrian Giovanne Nickolson, MD  oxybutynin (DITROPAN) 5 MG tablet Take 1 tablet (5 mg total) by mouth 3 (three) times daily. Patient not taking: Reported on 10/06/2014 04/25/14   Marcine MatarStephen Dahlstedt, MD  oxyCODONE (OXY IR/ROXICODONE) 5 MG immediate release tablet Take 1-2 tablets (5-10 mg total) by mouth every 4 (four) hours as needed for moderate pain. Patient not taking: Reported on 10/06/2014 04/27/14   Linwood DibblesJon Knapp, MD  oxyCODONE-acetaminophen (PERCOCET) 10-325 MG per tablet Take 1 tablet by mouth every 4 (four) hours  as needed for pain. Patient not taking: Reported on 10/06/2014 04/25/14   Marcine Matar, MD  promethazine (PHENERGAN) 12.5 MG tablet Take 1 tablet (12.5 mg total) by mouth every 6 (six) hours as needed for nausea or vomiting. Patient not taking: Reported on 10/06/2014 04/18/14   Eber Hong, MD  promethazine (PHENERGAN) 12.5 MG tablet Take 1 tablet (12.5 mg total) by mouth every 6 (six) hours as needed for nausea or vomiting. 10/06/14   Eber Hong, MD   BP 136/89 mmHg  Pulse 93  Temp(Src) 97.6 F (36.4 C) (Oral)   Resp 18  SpO2 96% Physical Exam  Constitutional: He appears well-developed and well-nourished. No distress.  HENT:  Head: Normocephalic and atraumatic.  Mouth/Throat: Oropharynx is clear and moist. No oropharyngeal exudate.  Eyes: Conjunctivae and EOM are normal. Pupils are equal, round, and reactive to light. Right eye exhibits no discharge. Left eye exhibits no discharge. No scleral icterus.  Neck: Normal range of motion. Neck supple. No JVD present. No thyromegaly present.  Cardiovascular: Normal rate, regular rhythm, normal heart sounds and intact distal pulses.  Exam reveals no gallop and no friction rub.   No murmur heard. Pulmonary/Chest: Effort normal and breath sounds normal. No respiratory distress. He has no wheezes. He has no rales.  Abdominal: Soft. Bowel sounds are normal. He exhibits no distension and no mass. There is no tenderness.  Genitourinary:  Normal appaering penis, testicles adn scrotum without masses, tenderness or blood at the meatus.  No hernias present  Musculoskeletal: Normal range of motion. He exhibits no edema or tenderness.  Mild CVA ttp  Lymphadenopathy:    He has no cervical adenopathy.  Neurological: He is alert. Coordination normal.  Skin: Skin is warm and dry. No rash noted. No erythema.  Psychiatric: He has a normal mood and affect. His behavior is normal.  Nursing note and vitals reviewed.  ED Course  Procedures (including critical care time) Labs Review Labs Reviewed  URINALYSIS, ROUTINE W REFLEX MICROSCOPIC - Abnormal; Notable for the following:    Specific Gravity, Urine 1.003 (*)    All other components within normal limits   Imaging Review No results found.  MDM   Final diagnoses:  Hematuria   Well appearing with normal VS other than mild htn, also has clean urine - pt has no hx of drug seeking, may have stone needing pain meds - toradol ordered, home with KS meds - Ua without infection.  Pt now states that the bleeding happened  after "rough sex" with his wife, and has no pain after toradol.    Meds given in ED:  Medications  ketorolac (TORADOL) injection 60 mg (60 mg Intramuscular Given 10/06/14 2133)   New Prescriptions   NAPROXEN (NAPROSYN) 500 MG TABLET    Take 1 tablet (500 mg total) by mouth 2 (two) times daily with a meal.   PROMETHAZINE (PHENERGAN) 12.5 MG TABLET    Take 1 tablet (12.5 mg total) by mouth every 6 (six) hours as needed for nausea or vomiting.        Eber Hong, MD 10/06/14 [phone]

## 2014-10-06 NOTE — ED Notes (Signed)
Pt states that he has had blood in his urine and has not been able to urinate as much as normal today. Hx of kidney stones. No significant pain at present. Alert and oriented.

## 2014-10-06 NOTE — Discharge Instructions (Signed)

## 2015-07-30 ENCOUNTER — Encounter (HOSPITAL_COMMUNITY): Payer: Self-pay | Admitting: Emergency Medicine

## 2015-07-30 ENCOUNTER — Emergency Department (HOSPITAL_COMMUNITY): Payer: Medicare Other

## 2015-07-30 ENCOUNTER — Emergency Department (HOSPITAL_COMMUNITY)
Admission: EM | Admit: 2015-07-30 | Discharge: 2015-07-30 | Disposition: A | Discharge status: Home / Self Care | Payer: Medicare Other | Attending: Emergency Medicine | Admitting: Emergency Medicine

## 2015-07-30 DIAGNOSIS — M1990 Unspecified osteoarthritis, unspecified site: Secondary | ICD-10-CM | POA: Insufficient documentation

## 2015-07-30 DIAGNOSIS — N200 Calculus of kidney: Secondary | ICD-10-CM | POA: Insufficient documentation

## 2015-07-30 DIAGNOSIS — F410 Panic disorder [episodic paroxysmal anxiety] without agoraphobia: Secondary | ICD-10-CM | POA: Insufficient documentation

## 2015-07-30 DIAGNOSIS — R109 Unspecified abdominal pain: Secondary | ICD-10-CM | POA: Diagnosis present

## 2015-07-30 DIAGNOSIS — Z79899 Other long term (current) drug therapy: Secondary | ICD-10-CM | POA: Diagnosis not present

## 2015-07-30 DIAGNOSIS — Z8719 Personal history of other diseases of the digestive system: Secondary | ICD-10-CM | POA: Insufficient documentation

## 2015-07-30 LAB — COMPREHENSIVE METABOLIC PANEL
ALT: 11 U/L — AB (ref 17–63)
ANION GAP: 10 (ref 5–15)
AST: 16 U/L (ref 15–41)
Albumin: 4.7 g/dL (ref 3.5–5.0)
Alkaline Phosphatase: 70 U/L (ref 38–126)
BUN: 16 mg/dL (ref 6–20)
CHLORIDE: 107 mmol/L (ref 101–111)
CO2: 28 mmol/L (ref 22–32)
Calcium: 9.7 mg/dL (ref 8.9–10.3)
Creatinine, Ser: 1.13 mg/dL (ref 0.61–1.24)
Glucose, Bld: 147 mg/dL — ABNORMAL HIGH (ref 65–99)
Potassium: 3.5 mmol/L (ref 3.5–5.1)
Sodium: 145 mmol/L (ref 135–145)
Total Bilirubin: 0.9 mg/dL (ref 0.3–1.2)
Total Protein: 7.4 g/dL (ref 6.5–8.1)

## 2015-07-30 LAB — CBC
HCT: 46.4 % (ref 39.0–52.0)
Hemoglobin: 15.6 g/dL (ref 13.0–17.0)
MCH: 30.7 pg (ref 26.0–34.0)
MCHC: 33.6 g/dL (ref 30.0–36.0)
MCV: 91.3 fL (ref 78.0–100.0)
Platelets: 268 10*3/uL (ref 150–400)
RBC: 5.08 MIL/uL (ref 4.22–5.81)
RDW: 12 % (ref 11.5–15.5)
WBC: 10.4 10*3/uL (ref 4.0–10.5)

## 2015-07-30 LAB — LIPASE, BLOOD: LIPASE: 28 U/L (ref 11–51)

## 2015-07-30 MED ORDER — ONDANSETRON HCL 4 MG/2ML IJ SOLN
4.0000 mg | Freq: Once | INTRAMUSCULAR | Status: AC | PRN
  Administered 2015-07-30: 4 mg via INTRAVENOUS
  Filled 2015-07-30: qty 2

## 2015-07-30 MED ORDER — KETOROLAC TROMETHAMINE 30 MG/ML IJ SOLN
30.0000 mg | Freq: Once | INTRAMUSCULAR | Status: AC
  Administered 2015-07-30: 30 mg via INTRAVENOUS
  Filled 2015-07-30: qty 1

## 2015-07-30 MED ORDER — TAMSULOSIN HCL 0.4 MG PO CAPS: 0.4000 mg | ORAL_CAPSULE | Freq: Every day | ORAL | Status: AC

## 2015-07-30 MED ORDER — SODIUM CHLORIDE 0.9 % IV BOLUS (SEPSIS)
500.0000 mL | Freq: Once | INTRAVENOUS | Status: AC
  Administered 2015-07-30: 500 mL via INTRAVENOUS

## 2015-07-30 MED ORDER — OXYCODONE-ACETAMINOPHEN 5-325 MG PO TABS: 1.0000 | ORAL_TABLET | ORAL | Status: AC | PRN

## 2015-07-30 MED ORDER — HYDROMORPHONE HCL 1 MG/ML IJ SOLN
1.0000 mg | Freq: Once | INTRAMUSCULAR | Status: AC | PRN
  Administered 2015-07-30: 1 mg via INTRAVENOUS
  Filled 2015-07-30: qty 1

## 2015-07-30 MED ORDER — NAPROXEN 500 MG PO TABS: 500.0000 mg | ORAL_TABLET | Freq: Two times a day (BID) | ORAL | Status: AC

## 2015-07-30 MED ORDER — FENTANYL CITRATE (PF) 100 MCG/2ML IJ SOLN
50.0000 ug | Freq: Once | INTRAMUSCULAR | Status: AC
  Administered 2015-07-30: 50 ug via INTRAVENOUS
  Filled 2015-07-30: qty 2

## 2015-07-30 MED ORDER — ONDANSETRON HCL 4 MG PO TABS: 4.0000 mg | ORAL_TABLET | Freq: Three times a day (TID) | ORAL | Status: AC | PRN

## 2015-07-30 NOTE — Discharge Instructions (Signed)
You have been diagnosed with kidney stones.  Use your pain medication as prescribed and do not operate heavy machinery while on this medication. Note that your pain medication contains Acetaminophen (Tylenol), therefore it is not recommended to take additional Tylenol while on your pain medication. Continue to drink fluids to help you pass the stones. Taking flomax as directed will also help to pass the stone. Use Zofran for nausea as directed. Follow up with the urology clinic listed above in regards to your hospital visit. You will need to call them today or tomorrow to schedule an appointment. Return to the ED immediately if you develop fever that persists > 101, uncontrolled pain or vomiting, or other concerns.  Read the instructions below to learn more about kidney stones.  Kidney Stones Kidney stones (ureteral lithiasis) are solid masses that form inside your kidneys. The intense pain is caused by the stone moving through the kidney, ureter, bladder, and urethra (urinary tract). When the stone moves, the ureter starts to spasm around the stone. The stone is usually passed in the urine.   HOME CARE  Drink enough fluids to keep your urine clear or pale yellow. This helps to get the stone out.   Only take medicine as told by your doctor.   Follow up with your doctor as told.   GET HELP RIGHT AWAY IF:   Your pain does not get better with medicine.   You have a fever.   Your pain increases and gets worse over 18 hours.   You have new belly (abdominal) pain.   You feel faint or pass out.   MAKE SURE YOU:   Understand these instructions.   Will watch your condition.   Will get help right away if you are not doing well or get worse.

## 2015-07-30 NOTE — ED Notes (Signed)
When nurse asks patient how he is getting home, patient states he will drive himself. Nurse reminds patient that he is unable to drive due to medications administered one hour ago. Patient told to call a cab, call a friend or we could offer him a bus pass. Patient agrees and is ambulating appropriately.

## 2015-07-30 NOTE — ED Notes (Signed)
Per pt, states right flank pain-burning with urination-started yesterday

## 2015-07-30 NOTE — ED Notes (Signed)
Bed: WA01 Expected date:  Expected time:  Means of arrival:  Comments: Hold for triage 1 

## 2015-07-30 NOTE — ED Notes (Signed)
Bed: WA02 Expected date:  Expected time:  Means of arrival:  Comments: 

## 2015-07-30 NOTE — ED Provider Notes (Signed)
CSN: [id number]     Arrival date & time 07/30/15  1023 History   First MD Initiated Contact with Patient 07/30/15 1039     Chief Complaint  Patient presents with  . Flank Pain     (Consider location/radiation/quality/duration/timing/severity/associated sxs/prior Treatment) Patient is a 45 y.o. male presenting with flank pain. The history is provided by the patient and medical records. No language interpreter was used.  Flank Pain Associated symptoms include nausea and vomiting. Pertinent negatives include no arthralgias, congestion, coughing, headaches, myalgias, rash, sore throat or weakness.   Arlon Dierdre Forth Stepney is a 45 y.o. male  with a PMH of anxiety, kidney stones who presents to the Emergency Department complaining of acute onset non-radiating, sharp right flank pain beginning this morning. Pt. Admits to associated dysuria last night. Per patient, sxs c/w previous kidney stone episodes. Has had to have surgery for a stone once in the past. + nausea + vomiting. Denies fever. No medications taken PTA.   Past Medical History  Diagnosis Date  . Arthritis   . GERD (gastroesophageal reflux disease)   . Kidney stones   . Anxiety     panic attacks  . Headache(784.0)     takes atenolol for headaches   Past Surgical History  Procedure Laterality Date  . Anterior cervical decomp/discectomy fusion  2012    c5 - c7  . Cystoscopy/retrograde/ureteroscopy/stone extraction with basket Left 04/25/2014    Procedure: CYSTOSCOPY/URETEROSCOPY/STONE EXTRACTION/STENT PLACEMENT;  Surgeon: Chelsea AusStephen M Dahlstedt, MD;  Location: [facility];  Service: Urology;  Laterality: Left;   Family History  Problem Relation Age of Onset  . Heart disease Maternal Grandfather   . Diabetes Maternal Grandfather    Social History  Substance Use Topics  . Smoking status: Never Smoker   . Smokeless tobacco: Never Used  . Alcohol Use: No    Review of Systems  Constitutional: Negative.   HENT: Negative for congestion,  rhinorrhea and sore throat.   Eyes: Negative for visual disturbance.  Respiratory: Negative for cough, shortness of breath and wheezing.   Cardiovascular: Negative.   Gastrointestinal: Positive for nausea and vomiting. Negative for diarrhea and constipation.  Genitourinary: Positive for dysuria and flank pain.  Musculoskeletal: Negative for myalgias, back pain and arthralgias.  Skin: Negative for rash.  Neurological: Negative for dizziness, weakness and headaches.      Allergies  Doxycycline; Septra; Sulfa antibiotics; Topiramate; and Ivp dye  Home Medications   Prior to Admission medications   Medication Sig Start Date End Date Taking? Authorizing Provider  ALPRAZolam Prudy Feeler(XANAX) 0.5 MG tablet Take 0.5-1 mg by mouth 2 (two) times daily as needed for anxiety.   Yes Historical Provider, MD  diphenhydrAMINE (BENADRYL) 25 MG tablet Take 25 mg by mouth every 6 (six) hours as needed for allergies.   Yes Historical Provider, MD  gabapentin (NEURONTIN) 100 MG capsule Take 100 mg by mouth 3 (three) times daily.   Yes Historical Provider, MD  sodium chloride (OCEAN) 0.65 % SOLN nasal spray Place 1-2 sprays into both nostrils as needed for congestion.   Yes Historical Provider, MD  traZODone (DESYREL) 100 MG tablet Take 200-300 mg by mouth at bedtime as needed for sleep.  03/04/15  Yes Historical Provider, MD  venlafaxine XR (EFFEXOR-XR) 75 MG 24 hr capsule Take 75 mg by mouth 3 (three) times daily.    Yes Historical Provider, MD  naproxen (NAPROSYN) 500 MG tablet Take 1 tablet (500 mg total) by mouth 2 (two) times daily. 07/30/15   Marijean NiemannJaime  Pilcher Ward, PA-C  ondansetron (ZOFRAN) 4 MG tablet Take 1 tablet (4 mg total) by mouth every 8 (eight) hours as needed for nausea or vomiting. 07/30/15   Chase Picket Ward, PA-C  oxyCODONE (OXY IR/ROXICODONE) 5 MG immediate release tablet Take 1-2 tablets (5-10 mg total) by mouth every 4 (four) hours as needed for moderate pain. Patient not taking: Reported on  10/06/2014 04/27/14   Linwood Dibbles, MD  oxyCODONE-acetaminophen (PERCOCET/ROXICET) 5-325 MG tablet Take 1-2 tablets by mouth every 4 (four) hours as needed for severe pain. 07/30/15   Chase Picket Ward, PA-C  tamsulosin (FLOMAX) 0.4 MG CAPS capsule Take 1 capsule (0.4 mg total) by mouth daily. 07/30/15   Jaime Pilcher Ward, PA-C   BP 119/82 mmHg  Pulse 100  Temp(Src) 98 F (36.7 C) (Oral)  Resp 21  SpO2 94% Physical Exam  Constitutional: He is oriented to person, place, and time. He appears well-developed and well-nourished.  Alert and in no acute distress  HENT:  Head: Normocephalic and atraumatic.  Cardiovascular: Normal rate, regular rhythm and normal heart sounds.  Exam reveals no gallop and no friction rub.   No murmur heard. Pulmonary/Chest: Effort normal and breath sounds normal. No respiratory distress. He has no wheezes. He has no rales.  Abdominal: He exhibits no mass. There is no rebound and no guarding.  Abdomen soft, non-tender, non-distended Bowel sounds positive in all four quadrants  Musculoskeletal:  TTP of right flank No CVA tenderness.   Neurological: He is alert and oriented to person, place, and time.  Skin: Skin is warm and dry. No rash noted.  Psychiatric: He has a normal mood and affect. His behavior is normal. Judgment and thought content normal.  Nursing note and vitals reviewed.   ED Course  Procedures (including critical care time) Labs Review Labs Reviewed  COMPREHENSIVE METABOLIC PANEL - Abnormal; Notable for the following:    Glucose, Bld 147 (*)    ALT 11 (*)    All other components within normal limits  LIPASE, BLOOD  CBC  URINALYSIS, ROUTINE W REFLEX MICROSCOPIC (NOT AT [facility])    Imaging Review Ct Renal Stone Study  07/30/2015  CLINICAL DATA:  45 year old male with right flank pain and burning with urination since yesterday. Initial encounter. EXAM: CT ABDOMEN AND PELVIS WITHOUT CONTRAST TECHNIQUE: Multidetector CT imaging of the abdomen and  pelvis was performed following the standard protocol without IV contrast. COMPARISON:  CT Abdomen and Pelvis 12/24/2014 and earlier. FINDINGS: Lower lung volumes with mild dependent atelectasis. Otherwise negative lung bases. No pericardial or pleural effusion. Incidental T11 spina bifida occulta. 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 No acute osseous abnormality identified. No pelvic free fluid. Decompressed rectum. Mild sigmoid redundancy and diverticulosis. Negative left colon. Negative transverse colon. Mild diverticulosis of the right colon with no active inflammation. Negative appendix and terminal ileum. No dilated small bowel. Negative stomach and duodenum. No abdominal free air or free fluid. Noncontrast liver, gallbladder, spleen, pancreas, and adrenal glands are within normal limits. Left lower pole nephrolithiasis no longer present. Negative noncontrast left kidney and ureter. Mild right hydronephrosis and nephro megaly. Mild right perinephric stranding. Right periureteral stranding and mild to moderate right hydro ureter to the level of a 4 mm distal calculus located 10 mm from the right ureterovesical junction (series 2, image 82 and coronal image 54). Negative urinary bladder. No pelvic phleboliths. No right intra renal calculus. IMPRESSION: 1.  Acute obstructive uropathy on the right with a 4 mm distal ureteral calculus located 10 mm from the right UVJ. 2. No other urologic calculus identified. Electronically Signed   By: Odessa Fleming M.D.   On: 07/30/2015 12:03   I have personally reviewed and evaluated these images and lab results as part of my medical decision-making.   EKG Interpretation None      MDM   Final diagnoses:  Flank pain  Kidney stone   Ceferino A Frenette presents with right flank pain. Hx of stone extraction and stent placement  Labs:CBC, CMP, lipase  Imaging: CT shows 4 mm  stone - see above report  Consults:Urology, Dr. Marlou Porch, who recommends flomax, pain control, and follow up tomorrow at [facility]   Therapeutics: Pain and nausea control while in ED 12:25 PM - Patient re-evaluated: pain well controlled.   A&P: Kidney stone  - Zofran, percocet, flomax, naproxen  - Increase hydration  - Follow up with urology tomorrow  Patient discussed with Dr. Adriana Simas who agrees with treatment plan.   [facility] Ward, PA-C 07/30/15 1346  Donnetta Hutching, MD 07/31/15 1257

## 2015-11-02 IMAGING — CT CT ABD-PELV W/O CM
1 series · 15 of 25 positions shown, 19 images · non-contrast
Comparison: CT 04/18/2014.

CLINICAL DATA: Left flank pain. Kidney stone. Follow-up evaluation.

EXAM:
CT ABDOMEN AND PELVIS WITHOUT CONTRAST
TECHNIQUE: Multidetector CT imaging of the abdomen and pelvis was performed
following the standard protocol without IV contrast.

[Series 4: lung · axial · 0.74mm/px · z∈[-145,-35]mm · 15 of 25 slices shown, 19 images]
[im 2/25  soft-tissue]
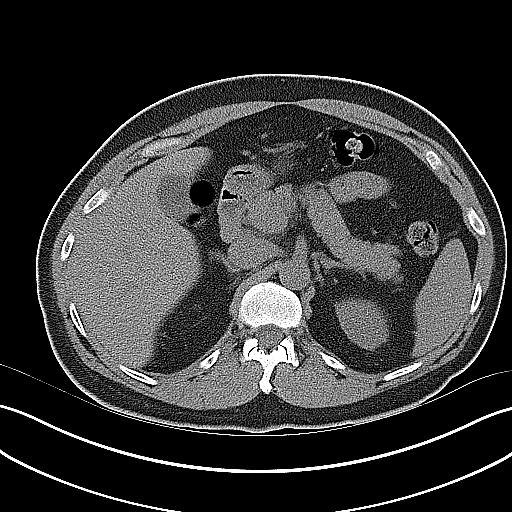
[im 2/25  bone]
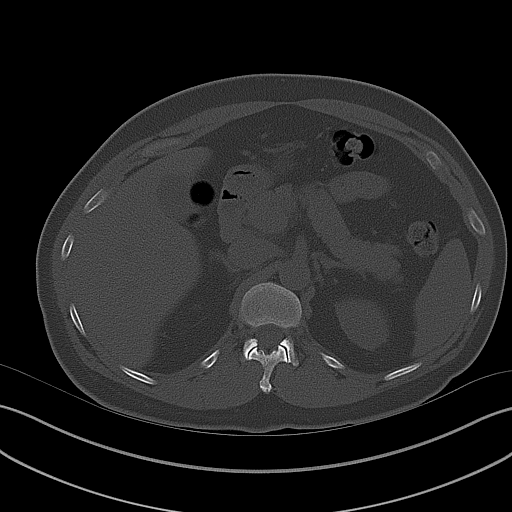
[im 4/25  soft-tissue]
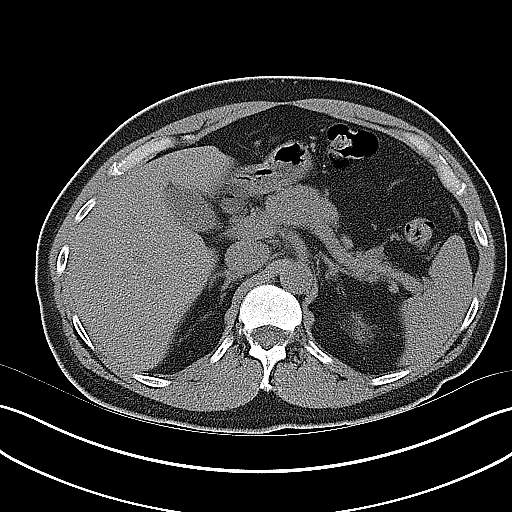
[im 6/25  soft-tissue]
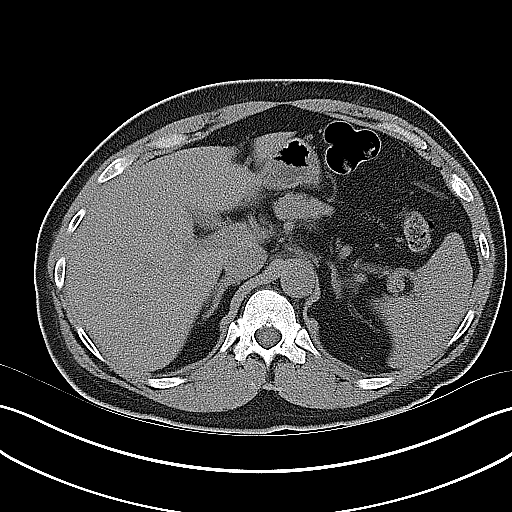
[im 8/25  soft-tissue]
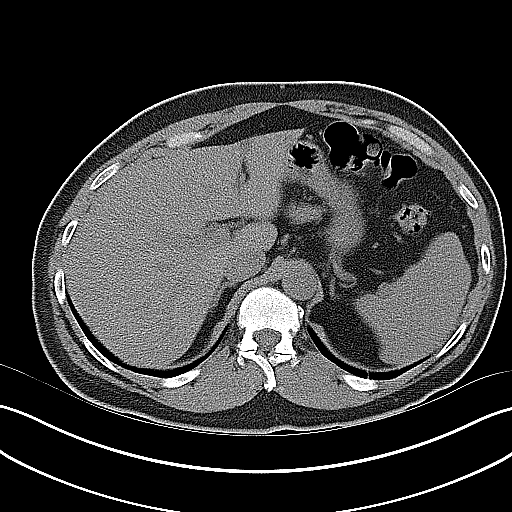
[im 9/25  soft-tissue]
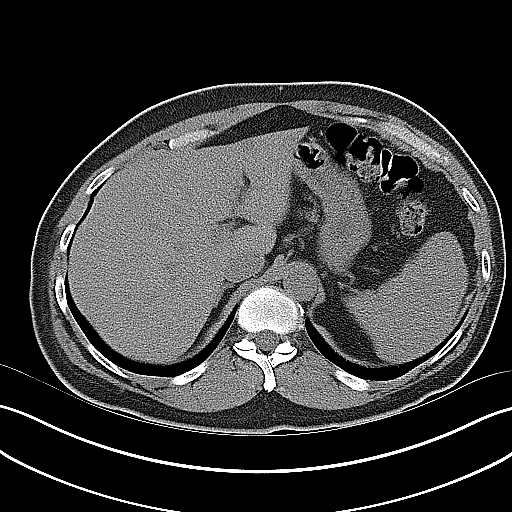
[im 11/25  soft-tissue]
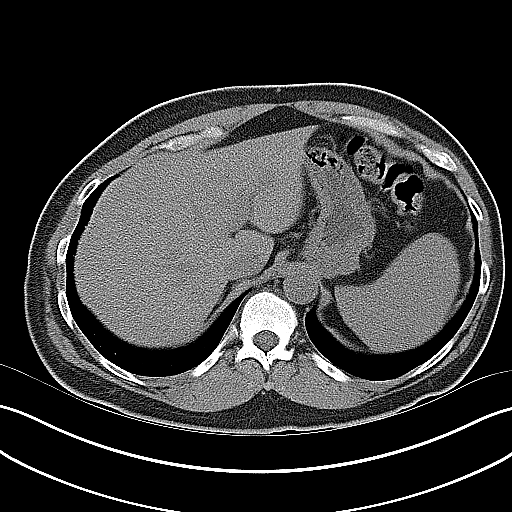
[im 13/25  soft-tissue]
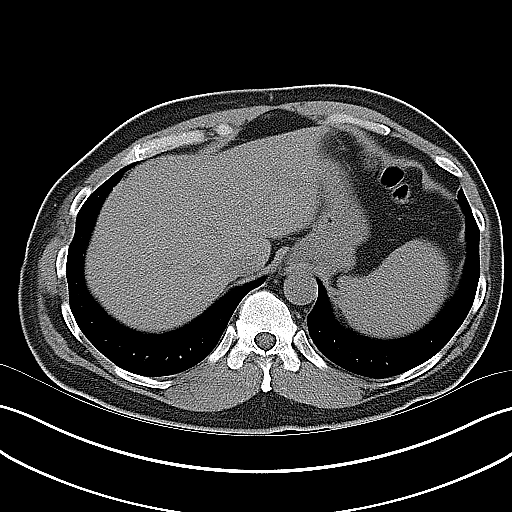
[im 15/25  soft-tissue]
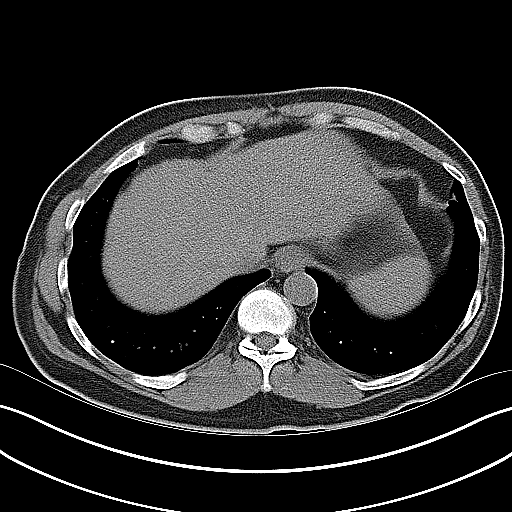
[im 17/25  soft-tissue]
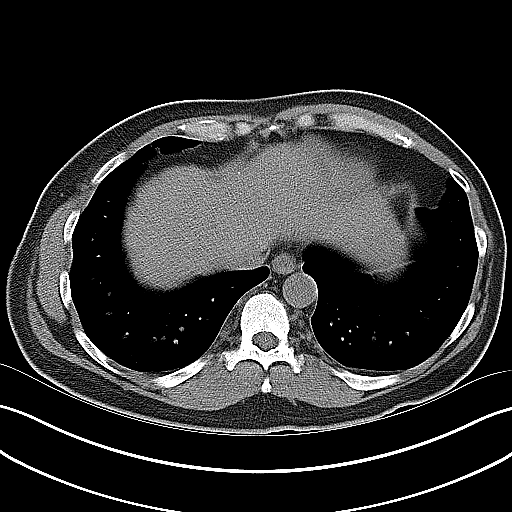
[im 17/25  bone]
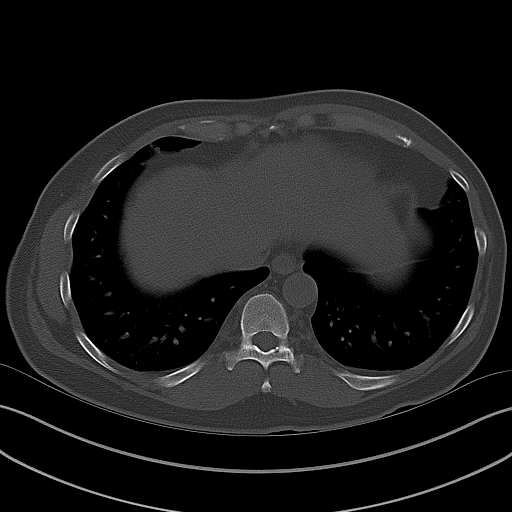
[im 18/25  soft-tissue]
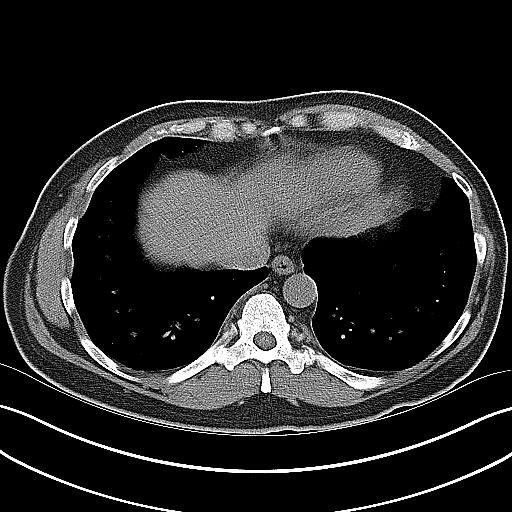
[im 20/25  soft-tissue]
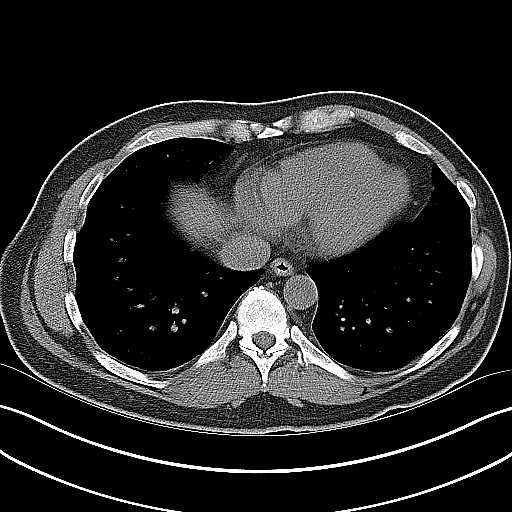
[im 21/25  lung]
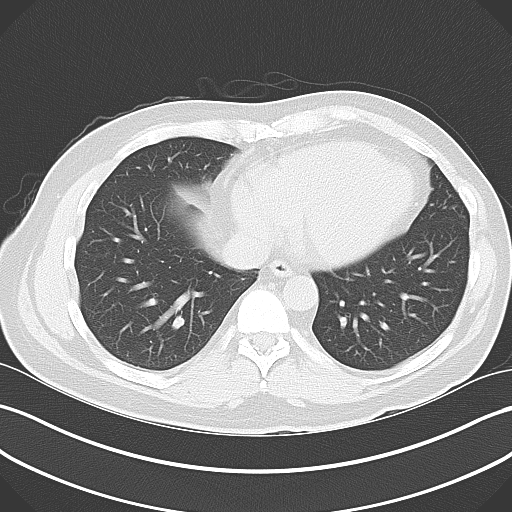
[im 22/25  soft-tissue]
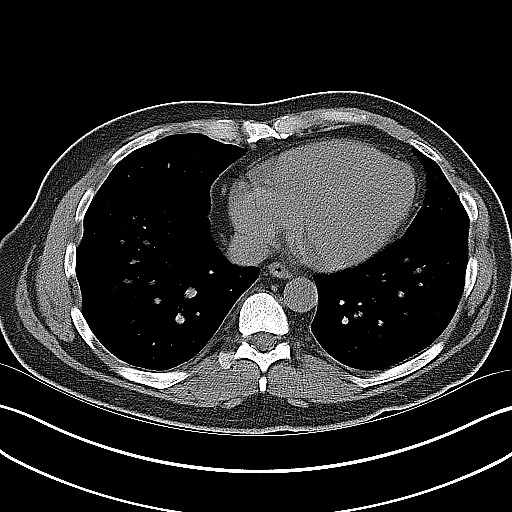
[im 22/25  lung]
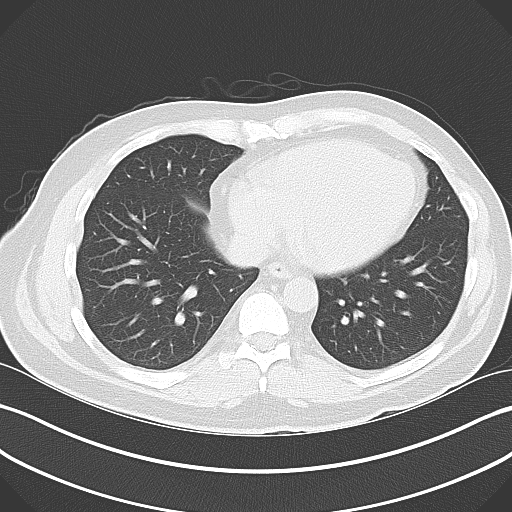
[im 23/25  lung]
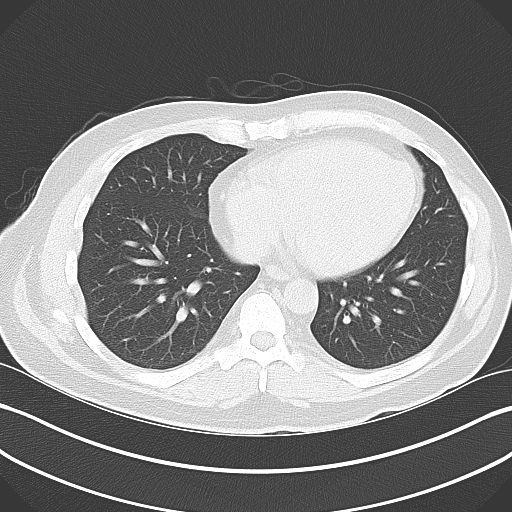
[im 24/25  soft-tissue]
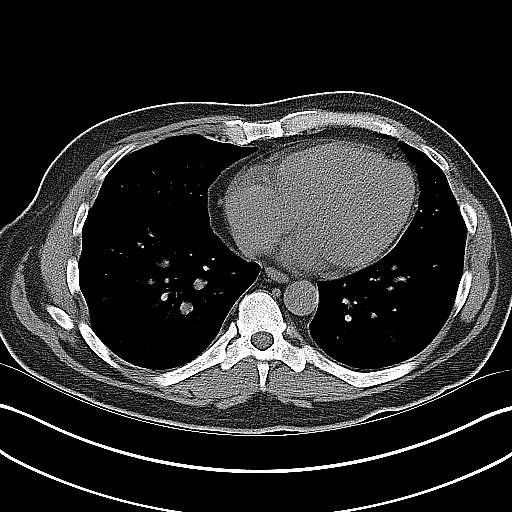
[im 24/25  lung]
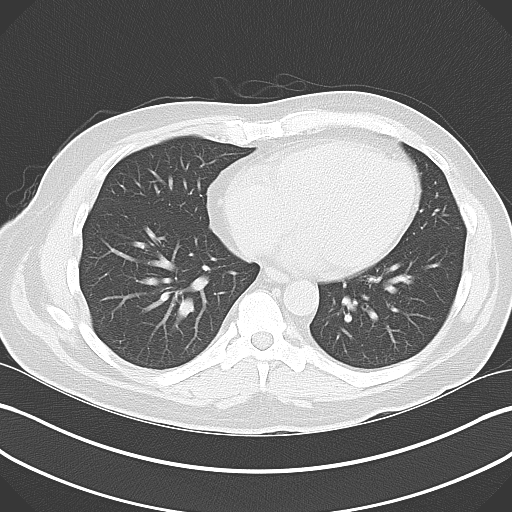

[15 of 25 positions shown; findings below may reference images not displayed]

FINDINGS: Liver normal. Spleen normal. Pancreas normal. No biliary distention.
Gallbladder is nondistended.

Adrenals normal. Right kidney unremarkable. 4 mm stone distal left
ureter is again noted. The stone has distended slightly from prior
study of 04/18/2014. There is associated mild left hydroureter and
hydronephrosis. Nonobstructing 3 mm stone in the left renal
collecting system noted. Bladder is nondistended. Prostate normal in
size.

No significant adenopathy.  Aorta normal caliber.

Appendix normal. Diverticulosis. No evidence of bowel obstruction.
No free air. No mesenteric mass. No significant abdominal wall
hernia.

Lung bases are clear. Mild cardiomegaly. No acute bony abnormality.
Stable sclerotic densities in the pelvis and hips consistent with
bone islands.
IMPRESSION: 1. 4 mm stone again noted in the distal left ureter. The stone has
descended slightly from prior study of 04/18/2014.

2.  Left nephrolithiasis.
